# Patient Record
Sex: Female | Born: 1995
Health system: Southern US, Community
[De-identification: ages and names within clinical notes are randomized; demographics above are authoritative.]

## PROBLEM LIST (undated history)

## (undated) ENCOUNTER — Inpatient Hospital Stay: Payer: Self-pay

## (undated) DIAGNOSIS — D649 Anemia, unspecified: Secondary | ICD-10-CM

## (undated) DIAGNOSIS — Z975 Presence of (intrauterine) contraceptive device: Secondary | ICD-10-CM

## (undated) DIAGNOSIS — K8050 Calculus of bile duct without cholangitis or cholecystitis without obstruction: Secondary | ICD-10-CM

## (undated) DIAGNOSIS — E221 Hyperprolactinemia: Secondary | ICD-10-CM

## (undated) DIAGNOSIS — Z87440 Personal history of urinary (tract) infections: Secondary | ICD-10-CM

## (undated) DIAGNOSIS — K8020 Calculus of gallbladder without cholecystitis without obstruction: Secondary | ICD-10-CM

## (undated) DIAGNOSIS — L239 Allergic contact dermatitis, unspecified cause: Secondary | ICD-10-CM

## (undated) DIAGNOSIS — N926 Irregular menstruation, unspecified: Secondary | ICD-10-CM

## (undated) DIAGNOSIS — E236 Other disorders of pituitary gland: Secondary | ICD-10-CM

## (undated) DIAGNOSIS — N643 Galactorrhea not associated with childbirth: Secondary | ICD-10-CM

## (undated) DIAGNOSIS — N911 Secondary amenorrhea: Secondary | ICD-10-CM

## (undated) DIAGNOSIS — O209 Hemorrhage in early pregnancy, unspecified: Secondary | ICD-10-CM

## (undated) HISTORY — DX: Hyperprolactinemia: E22.1

## (undated) HISTORY — PX: OTHER SURGICAL HISTORY: SHX169

## (undated) HISTORY — DX: Presence of (intrauterine) contraceptive device: Z97.5

## (undated) HISTORY — DX: Irregular menstruation, unspecified: N92.6

## (undated) HISTORY — DX: Personal history of urinary (tract) infections: Z87.440

## (undated) HISTORY — DX: Hemorrhage in early pregnancy, unspecified: O20.9

## (undated) HISTORY — DX: Calculus of gallbladder without cholecystitis without obstruction: K80.20

## (undated) HISTORY — DX: Other disorders of pituitary gland: E23.6

## (undated) HISTORY — DX: Secondary amenorrhea: N91.1

## (undated) HISTORY — DX: Galactorrhea not associated with childbirth: N64.3

## (undated) HISTORY — DX: Calculus of bile duct without cholangitis or cholecystitis without obstruction: K80.50

## (undated) HISTORY — PX: NO PAST SURGERIES: SHX2092

## (undated) HISTORY — DX: Allergic contact dermatitis, unspecified cause: L23.9

---
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
## 2017-01-07 ENCOUNTER — Encounter: Payer: Self-pay | Admitting: Certified Nurse Midwife

## 2017-01-07 ENCOUNTER — Ambulatory Visit (INDEPENDENT_AMBULATORY_CARE_PROVIDER_SITE_OTHER): Payer: Medicaid Other | Admitting: Certified Nurse Midwife

## 2017-01-07 VITALS — BP 124/91 | HR 80 | Ht 60.0 in | Wt 141.7 lb

## 2017-01-07 DIAGNOSIS — N898 Other specified noninflammatory disorders of vagina: Secondary | ICD-10-CM | POA: Diagnosis not present

## 2017-01-07 DIAGNOSIS — Z30431 Encounter for routine checking of intrauterine contraceptive device: Secondary | ICD-10-CM

## 2017-01-07 NOTE — Progress Notes (Signed)
     GYNECOLOGY OFFICE PROGRESS NOTE  History:  21 y.o. G1P1001 here today for today for IUD string check; ParaGard IUD was placed  12/10/2016.   Reports intermittent abdominal cramping and increased vaginal discharge.   Denies difficulty breathing or respiratory distress, chest pain, abdominal pain, excessive vaginal bleeding, dysuria, and leg pain or swelling.   The following portions of the patient's history were reviewed and updated as appropriate: allergies, current medications, past family history, past medical history, past social history, past surgical history and problem list. Last pap smear: N/A.   Review of Systems:   Pertinent items are noted in HPI.  Objective:   Blood pressure (!) 124/91, pulse 80, height 5' (1.524 m), weight 141 lb 11.2 oz (64.3 kg), last menstrual period 12/10/2016, not currently breastfeeding.   Physical Exam  CONSTITUTIONAL: Well-developed, well-nourished female in no acute distress.   HENT:  Normocephalic, atraumatic. External right and left ear normal. Oropharynx is clear and moist  EYES: Conjunctivae and EOM are normal. Pupils are equal, round, and reactive to light. No scleral icterus.   NECK: Normal range of motion, supple, no masses  ABDOMEN: Soft, no distention noted.    PELVIC: Normal appearing external genitalia; normal appearing vaginal mucosa and cervix, vaginal discharge present.  IUD strings visualized, about three (3) cm in length outside cervix.   Assessment & Plan:  Normal IUD check.  NuSwab collected, will notify patient via MyChart with results.   Patient to keep IUD in place for 10 years; can come in for removal if she desires pregnancy within the next five years.  Routine preventative health maintenance measures emphasized.  Reviewed red flag symptoms and when to call.   RTC as needed.    Alicia Sullivan, CNM

## 2017-01-07 NOTE — Patient Instructions (Signed)
Intrauterine Device Information An intrauterine device (IUD) is inserted into your uterus to prevent pregnancy. There are two types of IUDs available:  Copper IUD-This type of IUD is wrapped in copper wire and is placed inside the uterus. Copper makes the uterus and fallopian tubes produce a fluid that kills sperm. The copper IUD can stay in place for 10 years.  Hormone IUD-This type of IUD contains the hormone progestin (synthetic progesterone). The hormone thickens the cervical mucus and prevents sperm from entering the uterus. It also thins the uterine lining to prevent implantation of a fertilized egg. The hormone can weaken or kill the sperm that get into the uterus. One type of hormone IUD can stay in place for 5 years, and another type can stay in place for 3 years.  Your health care provider will make sure you are a good candidate for a contraceptive IUD. Discuss with your health care provider the possible side effects. Advantages of an intrauterine device  IUDs are highly effective, reversible, long acting, and low maintenance.  There are no estrogen-related side effects.  An IUD can be used when breastfeeding.  IUDs are not associated with weight gain.  The copper IUD works immediately after insertion.  The hormone IUD works right away if inserted within 7 days of your period starting. You will need to use a backup method of birth control for 7 days if the hormone IUD is inserted at any other time in your cycle.  The copper IUD does not interfere with your female hormones.  The hormone IUD can make heavy menstrual periods lighter and decrease cramping.  The hormone IUD can be used for 3 or 5 years.  The copper IUD can be used for 10 years. Disadvantages of an intrauterine device  The hormone IUD can be associated with irregular bleeding patterns.  The copper IUD can make your menstrual flow heavier and more painful.  You may experience cramping and vaginal bleeding after  insertion. This information is not intended to replace advice given to you by your health care provider. Make sure you discuss any questions you have with your health care provider. Document Released: 01/20/2004 Document Revised: 07/24/2015 Document Reviewed: 08/06/2012 Elsevier Interactive Patient Education  2017 [place] 18-39 Years, Female Preventive care refers to lifestyle choices and visits with your health care provider that can promote health and wellness. What does preventive care include?  A yearly physical exam. This is also called an annual well check.  Dental exams once or twice a year.  Routine eye exams. Ask your health care provider how often you should have your eyes checked.  Personal lifestyle choices, including: ? Daily care of your teeth and gums. ? Regular physical activity. ? Eating a healthy diet. ? Avoiding tobacco and drug use. ? Limiting alcohol use. ? Practicing safe sex. ? Taking vitamin and mineral supplements as recommended by your health care provider. What happens during an annual well check? The services and screenings done by your health care provider during your annual well check will depend on your age, overall health, lifestyle risk factors, and family history of disease. Counseling Your health care provider may ask you questions about your:  Alcohol use.  Tobacco use.  Drug use.  Emotional well-being.  Home and relationship well-being.  Sexual activity.  Eating habits.  Work and work Statistician.  Method of birth control.  Menstrual cycle.  Pregnancy history.  Screening You may have the following tests or measurements:  Height, weight,  and BMI.  Diabetes screening. This is done by checking your blood sugar (glucose) after you have not eaten for a while (fasting).  Blood pressure.  Lipid and cholesterol levels. These may be checked every 5 years starting at age 60.  Skin check.  Hepatitis C blood  test.  Hepatitis B blood test.  Sexually transmitted disease (STD) testing.  BRCA-related cancer screening. This may be done if you have a family history of breast, ovarian, tubal, or peritoneal cancers.  Pelvic exam and Pap test. This may be done every 3 years starting at age 5. Starting at age 17, this may be done every 5 years if you have a Pap test in combination with an HPV test.  Discuss your test results, treatment options, and if necessary, the need for more tests with your health care provider. Vaccines Your health care provider may recommend certain vaccines, such as:  Influenza vaccine. This is recommended every year.  Tetanus, diphtheria, and acellular pertussis (Tdap, Td) vaccine. You may need a Td booster every 10 years.  Varicella vaccine. You may need this if you have not been vaccinated.  HPV vaccine. If you are 12 or younger, you may need three doses over 6 months.  Measles, mumps, and rubella (MMR) vaccine. You may need at least one dose of MMR. You may also need a second dose.  Pneumococcal 13-valent conjugate (PCV13) vaccine. You may need this if you have certain conditions and were not previously vaccinated.  Pneumococcal polysaccharide (PPSV23) vaccine. You may need one or two doses if you smoke cigarettes or if you have certain conditions.  Meningococcal vaccine. One dose is recommended if you are age 78-21 years and a first-year college student living in a residence hall, or if you have one of several medical conditions. You may also need additional booster doses.  Hepatitis A vaccine. You may need this if you have certain conditions or if you travel or work in places where you may be exposed to hepatitis A.  Hepatitis B vaccine. You may need this if you have certain conditions or if you travel or work in places where you may be exposed to hepatitis B.  Haemophilus influenzae type b (Hib) vaccine. You may need this if you have certain risk factors.  Talk  to your health care provider about which screenings and vaccines you need and how often you need them. This information is not intended to replace advice given to you by your health care provider. Make sure you discuss any questions you have with your health care provider. Document Released: 04/13/2001 Document Revised: 11/05/2015 Document Reviewed: 12/17/2014 Elsevier Interactive Patient Education  2017 [employer].

## 2017-01-12 LAB — NUSWAB VAGINITIS PLUS (VG+)
ATOPOBIUM VAGINAE: HIGH {score} — AB
CANDIDA ALBICANS, NAA: NEGATIVE
CANDIDA GLABRATA, NAA: NEGATIVE
CHLAMYDIA TRACHOMATIS, NAA: NEGATIVE
NEISSERIA GONORRHOEAE, NAA: NEGATIVE
TRICH VAG BY NAA: NEGATIVE

## 2017-01-14 ENCOUNTER — Encounter: Payer: Self-pay | Admitting: Certified Nurse Midwife

## 2017-03-10 ENCOUNTER — Encounter: Payer: Self-pay | Admitting: Certified Nurse Midwife

## 2017-03-10 ENCOUNTER — Ambulatory Visit (INDEPENDENT_AMBULATORY_CARE_PROVIDER_SITE_OTHER): Payer: Medicaid Other | Admitting: Certified Nurse Midwife

## 2017-03-10 ENCOUNTER — Encounter: Payer: Medicaid Other | Admitting: Certified Nurse Midwife

## 2017-03-10 VITALS — BP 119/85 | HR 86 | Wt 139.8 lb

## 2017-03-10 DIAGNOSIS — L292 Pruritus vulvae: Secondary | ICD-10-CM

## 2017-03-10 DIAGNOSIS — Z975 Presence of (intrauterine) contraceptive device: Secondary | ICD-10-CM

## 2017-03-10 DIAGNOSIS — N898 Other specified noninflammatory disorders of vagina: Secondary | ICD-10-CM

## 2017-03-10 NOTE — Progress Notes (Signed)
Contraception- Patient currently has an IUD. She is having a moderate amount of brown discharge.

## 2017-03-10 NOTE — Patient Instructions (Signed)
Kegel Exercises Kegel exercises help strengthen the muscles that support the rectum, vagina, small intestine, bladder, and uterus. Doing Kegel exercises can help:  Improve bladder and bowel control.  Improve sexual response.  Reduce problems and discomfort during pregnancy.  Kegel exercises involve squeezing your pelvic floor muscles, which are the same muscles you squeeze when you try to stop the flow of urine. The exercises can be done while sitting, standing, or lying down, but it is best to vary your position. Phase 1 exercises 1. Squeeze your pelvic floor muscles tight. You should feel a tight lift in your rectal area. If you are a female, you should also feel a tightness in your vaginal area. Keep your stomach, buttocks, and legs relaxed. 2. Hold the muscles tight for up to 10 seconds. 3. Relax your muscles. Repeat this exercise 50 times a day or as many times as told by your health care provider. Continue to do this exercise for at least 4-6 weeks or for as long as told by your health care provider. This information is not intended to replace advice given to you by your health care provider. Make sure you discuss any questions you have with your health care provider. Document Released: 02/02/2012 Document Revised: 10/11/2015 Document Reviewed: 01/05/2015 Elsevier Interactive Patient Education  2018 [employer] Vaginal Yeast infection, Adult Vaginal yeast infection is a condition that causes soreness, swelling, and redness (inflammation) of the vagina. It also causes vaginal discharge. This is a common condition. Some women get this infection frequently. What are the causes? This condition is caused by a change in the normal balance of the yeast (candida) and bacteria that live in the vagina. This change causes an overgrowth of yeast, which causes the inflammation. What increases the risk? This condition is more likely to develop in:  Women who take antibiotic medicines.  Women  who have diabetes.  Women who take birth control pills.  Women who are pregnant.  Women who douche often.  Women who have a weak defense (immune) system.  Women who have been taking steroid medicines for a long time.  Women who frequently wear tight clothing.  What are the signs or symptoms? Symptoms of this condition include:  White, thick vaginal discharge.  Swelling, itching, redness, and irritation of the vagina. The lips of the vagina (vulva) may be affected as well.  Pain or a burning feeling while urinating.  Pain during sex.  How is this diagnosed? This condition is diagnosed with a medical history and physical exam. This will include a pelvic exam. Your health care provider will examine a sample of your vaginal discharge under a microscope. Your health care provider may send this sample for testing to confirm the diagnosis. How is this treated? This condition is treated with medicine. Medicines may be over-the-counter or prescription. You may be told to use one or more of the following:  Medicine that is taken orally.  Medicine that is applied as a cream.  Medicine that is inserted directly into the vagina (suppository).  Follow these instructions at home:  Take or apply over-the-counter and prescription medicines only as told by your health care provider.  Do not have sex until your health care provider has approved. Tell your sex partner that you have a yeast infection. That person should go to his or her health care provider if he or she develops symptoms.  Do not wear tight clothes, such as pantyhose or tight pants.  Avoid using tampons until your health care  provider approves.  Eat more yogurt. This may help to keep your yeast infection from returning.  Try taking a sitz bath to help with discomfort. This is a warm water bath that is taken while you are sitting down. The water should only come up to your hips and should cover your buttocks. Do this 3-4  times per day or as told by your health care provider.  Do not douche.  Wear breathable, cotton underwear.  If you have diabetes, keep your blood sugar levels under control. Contact a health care provider if:  You have a fever.  Your symptoms go away and then return.  Your symptoms do not get better with treatment.  Your symptoms get worse.  You have new symptoms.  You develop blisters in or around your vagina.  You have blood coming from your vagina and it is not your menstrual period.  You develop pain in your abdomen. This information is not intended to replace advice given to you by your health care provider. Make sure you discuss any questions you have with your health care provider. Document Released: 11/25/2004 Document Revised: 07/30/2015 Document Reviewed: 08/19/2014 Elsevier Interactive Patient Education  2018 [employer].

## 2017-03-13 DIAGNOSIS — Z975 Presence of (intrauterine) contraceptive device: Secondary | ICD-10-CM | POA: Insufficient documentation

## 2017-03-13 HISTORY — DX: Presence of (intrauterine) contraceptive device: Z97.5

## 2017-03-13 MED ORDER — FLUCONAZOLE 150 MG PO TABS: 150.0000 mg | ORAL_TABLET | Freq: Once | ORAL | 0 refills | Status: AC

## 2017-03-13 NOTE — Progress Notes (Signed)
GYN ENCOUNTER NOTE  Subjective:       Alicia Sullivan is a 22 y.o. G74P1001 female is here for gynecologic evaluation of the following issues:  1. Brown vaginal discharge 2. Vulvar itching  3. Vaginal odor  Endorses symptoms since 02/21/2017, but itching did not start until approximately two(2) days ago.   Denies difficulty breathing or respiratory distress, chest pain, abdominal pain, vaginal bleeding, dysuria, and leg pain or swelling.   Gynecologic History  Patient's last menstrual period was 02/21/2017 (approximate).  Contraception: IUD, ParaGard  Last Pap: N/A.   Obstetric History  OB History  Gravida Para Term Preterm AB Living  1 1 1     1   SAB TAB Ectopic Multiple Live Births        0 1    # Outcome Date GA Lbr Len/2nd Weight Sex Delivery Anes PTL Lv  1 Term 10/23/16 [redacted]w[redacted]d / 00:13 5 lb 4 oz (2.38 kg) F Vag-Spont EPI  LIV     Birth Comments: no anomalies noted at delivery      Past Medical History:  Diagnosis Date  . Amenorrhea, secondary    pregnant  . Biliary colic   . Galactorrhea 01/08/2014  . Gallstones 09/22/2016  . 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 Irregular menses; hx   . 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 Recent urinary tract infection 02/18/2016   seen in ER  . Vaginal bleeding in pregnancy, first trimester 03/10/2016    Past Surgical History:  Procedure Laterality Date  . none      No current outpatient medications on file prior to visit.   No current facility-administered medications on file prior to visit.     No Known Allergies  Social History   Socioeconomic History  . Marital status: Married    Spouse name: Not on file  . Number of children: Not on file  . Years of education: Not on file  . Highest education level: Not on file  Social Needs  . Financial resource strain: Not on file  . Food insecurity - worry: Not on file  . Food insecurity - inability: Not on file  . Transportation needs - medical: Not on file  .  Transportation needs - non-medical: Not on file  Occupational History    Comment: nonemployed  Tobacco Use  . Smoking status: Never Smoker  . Smokeless tobacco: Never Used  Substance and Sexual Activity  . Alcohol use: No    Comment: before pregnancy  . Drug use: No  . Sexual activity: Yes    Partners: Male    Birth control/protection: IUD  Other Topics Concern  . Not on file  Social History Narrative  . Not on file    Family History  Problem Relation Age of Onset  . Diabetes Father   . Hyperlipidemia Father     The following portions of the patient's history were reviewed and updated as appropriate: allergies, current medications, past family history, past medical history, past social history, past surgical history and problem list.  Review of Systems  Review of Systems - Negative except as noted above. History obtained from the patient.  Objective:   BP 119/85   Pulse 86   Wt 139 lb 12.8 oz (63.4 kg)   LMP 02/21/2017 (Approximate)   Breastfeeding? No   BMI 27.30 kg/m    CONSTITUTIONAL: Well-developed, well-nourished female in no acute distress.   HENT:  Normocephalic, atraumatic.    SKIN: Skin is warm and dry. No rash noted.  Not diaphoretic. No erythema. No pallor.  NEUROLGIC: Alert and oriented to person, place, and time.   PSYCHIATRIC: Normal mood and affect. Normal behavior. Normal judgment and thought content.  ABDOMEN: Soft, non distended; Non tender.  No Organomegaly.  PELVIC:  External Genitalia: Erythema noted, white thick discharge present  Vagina: Thick white discharge present  Cervix: Normal, IUD strings present  Uterus: Normal size, shape,consistency,mobile  Adnexa: Normal   MUSCULOSKELETAL: Normal range of motion. No tenderness.  No cyanosis, clubbing, or edema.  Assessment:   1. Vaginal discharge  - NuSwab Vaginitis (VG)  2. Vulvar itching  - NuSwab Vaginitis (VG)     Plan:   Labs: NuSwab collected, will contact pt via  MyChart with results.   Rx: Diflucan, see orders.   Discussed vaginal health techniques.   Reviewed red flag symptoms and when to call.   RTC if symptoms worsen or fail to improve.    Gunnar BullaJenkins Michelle Fin Hupp, CNM [facility], [facility]

## 2017-03-16 LAB — NUSWAB VAGINITIS (VG)
CANDIDA GLABRATA, NAA: NEGATIVE
Candida albicans, NAA: POSITIVE — AB
TRICH VAG BY NAA: NEGATIVE

## 2017-04-29 ENCOUNTER — Ambulatory Visit (INDEPENDENT_AMBULATORY_CARE_PROVIDER_SITE_OTHER): Payer: Medicaid Other | Admitting: Surgery

## 2017-04-29 ENCOUNTER — Encounter: Payer: Self-pay | Admitting: Surgery

## 2017-04-29 VITALS — BP 142/91 | HR 80 | Temp 97.7°F | Ht 60.0 in | Wt 143.8 lb

## 2017-04-29 DIAGNOSIS — K8020 Calculus of gallbladder without cholecystitis without obstruction: Secondary | ICD-10-CM

## 2017-04-29 NOTE — Progress Notes (Signed)
Alicia Sullivan is an 22 y.o. female.   Chief Complaint:gallstones  Referred by OB/GYN HPI: This patient several months postpartum who has known gallstones.  I cannot see in her record where she has been to the ER recently for any pain.  Patient reports that she has been asymptomatic since being postpartum.  She has no nausea vomiting fevers or chills no right upper quadrant pain.  She has been essentially asymptomatic but does report occasional chest pain when she eats spicy foods  Past Medical History:  Diagnosis Date  . Allergic contact dermatitis 11/10/2016  . Amenorrhea, secondary    pregnant  . Biliary colic   . Galactorrhea 01/08/2014  . Gallstones 09/22/2016  . 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 Irregular menses; hx   . IUD (intrauterine device) in place 03/13/2017  . 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 Recent urinary tract infection 02/18/2016   seen in ER  . Vaginal bleeding in pregnancy, first trimester 03/10/2016    Past Surgical History:  Procedure Laterality Date  . none      Family History  Problem Relation Age of Onset  . Diabetes Father   . Hyperlipidemia Father    Social History:  reports that  has never smoked. she has never used smokeless tobacco. She reports that she does not drink alcohol or use drugs.  Allergies: No Known Allergies   (Not in a hospital admission)   Review of Systems:   Review of Systems  Constitutional: Negative.   HENT: Negative.   Eyes: Negative.   Respiratory: Negative.   Cardiovascular: Negative.   Gastrointestinal: Negative for abdominal pain, blood in stool, constipation, diarrhea, heartburn, nausea and vomiting.  Genitourinary: Negative.   Musculoskeletal: Negative.   Skin: Negative.   Neurological: Negative.   Endo/Heme/Allergies: Negative.   Psychiatric/Behavioral: Negative.     Physical Exam:  Physical Exam  Constitutional: She is oriented to person, place, and time and well-developed, well-nourished,  and in no distress. No distress.  HENT:  Head: Normocephalic and atraumatic.  Eyes: Pupils are equal, round, and reactive to light. Right eye exhibits no discharge. Left eye exhibits no discharge. No scleral icterus.  Neck: Normal range of motion. No JVD present.  Cardiovascular: Normal rate, regular rhythm and normal heart sounds.  Pulmonary/Chest: Effort normal and breath sounds normal. No respiratory distress. She has no wheezes. She has no rales.  Abdominal: Soft. She exhibits no distension. There is no tenderness. There is no rebound and no guarding.  Musculoskeletal: Normal range of motion. She exhibits no edema or tenderness.  Lymphadenopathy:    She has no cervical adenopathy.  Neurological: She is alert and oriented to person, place, and time.  Skin: Skin is warm and dry. No rash noted. She is not diaphoretic. No erythema.  Psychiatric: Mood and affect normal.  Vitals reviewed.   not currently breastfeeding.    No results found for this or any previous visit (from the past 48 hour(s)). No results found.   Assessment/Plan This a patient with known gallstones.  She has had no recent labs but her ultrasound is been reviewed that showed stones without signs of acute cholecystitis.  I discussed with her the rationale for offering surgery at some point and the options of continued observation.  I reviewed the risks of bleeding and infection recurrence conversion to an open procedure recurrence of symptoms and bile duct injury bowel injury or bile duct leak.  At this point patient recognizes that her symptoms are easily controlled with diet  and she has not had any symptoms in 2 weeks.  Her symptoms then were very mild.  She wishes to forego surgery at this time.  I suggested she come back to see me in 1 month and we can review her symptoms and discuss surgical options should her symptoms become worse.  Lattie Haw, MD, FACS

## 2017-04-29 NOTE — Patient Instructions (Signed)
Please keep a diary of the foods you eat that cause you to have chest pain. Please see your follow up appointment listed below.

## 2017-06-01 ENCOUNTER — Ambulatory Visit: Payer: Self-pay | Admitting: Surgery

## 2017-07-05 ENCOUNTER — Ambulatory Visit (INDEPENDENT_AMBULATORY_CARE_PROVIDER_SITE_OTHER): Payer: Medicaid Other | Admitting: Certified Nurse Midwife

## 2017-07-05 VITALS — BP 113/76 | HR 73 | Ht 60.0 in | Wt 143.2 lb

## 2017-07-05 DIAGNOSIS — Z202 Contact with and (suspected) exposure to infections with a predominantly sexual mode of transmission: Secondary | ICD-10-CM

## 2017-07-05 DIAGNOSIS — N898 Other specified noninflammatory disorders of vagina: Secondary | ICD-10-CM | POA: Diagnosis not present

## 2017-07-05 NOTE — Progress Notes (Signed)
Pt would like STD testing. Also c/o vag odor.

## 2017-07-05 NOTE — Patient Instructions (Signed)

## 2017-07-05 NOTE — Progress Notes (Signed)
GYN ENCOUNTER NOTE  Subjective:       Alicia Sullivan is a 22 y.o. G45P1001 female is here for gynecologic evaluation of the following issues:  1. Vaginal discharge  2. Vaginal odor   Reports increased discharge and vaginal odor for the last three (3) weeks. Usually happens immediately prior to menses, but typical does not last for this long. Has not attempted any home treatment measures. Desires STI testing.   Denies difficulty breathing or respiratory distress, chest pain, abdominal pain, excessive vaginal bleeding, dysuria, and leg pain or swelling.    Gynecologic History  Patient's last menstrual period was 07/05/2017 (exact date).  Contraception: IUD, ParaGard  Last Pap: N/A.    Obstetric History  OB History  Gravida Para Term Preterm AB Living  SAB TAB Ectopic Multiple Live Births        0 1    # Outcome Date GA Lbr Len/2nd Weight Sex Delivery Anes PTL Lv  1 Term 10/23/16 [redacted]w[redacted]d / 00:13 5 lb 4 oz (2.38 kg) F Vag-Spont EPI  LIV     Birth Comments: no anomalies noted at delivery    Past Medical History:  Diagnosis Date  . Allergic contact dermatitis 11/10/2016  . Amenorrhea, secondary    pregnant  . Biliary colic   . Galactorrhea 01/08/2014  . Gallstones 09/22/2016  . 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 Irregular menses; hx   . IUD (intrauterine device) in place 03/13/2017  . 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 Recent urinary tract infection 02/18/2016   seen in ER  . Vaginal bleeding in pregnancy, first trimester 03/10/2016    Past Surgical History:  Procedure Laterality Date  . none      No current outpatient medications on file prior to visit.   No current facility-administered medications on file prior to visit.     No Known Allergies  Social History   Socioeconomic History  . Marital status: Married    Spouse name: Not on file  . Number of children: Not on file  . Years of education: Not on file  . Highest education level: Not  on file  Occupational History    Comment: nonemployed  Social Needs  . Financial resource strain: Not on file  . Food insecurity:    Worry: Not on file    Inability: Not on file  . Transportation needs:    Medical: Not on file    Non-medical: Not on file  Tobacco Use  . Smoking status: Never Smoker  . Smokeless tobacco: Never Used  Substance and Sexual Activity  . Alcohol use: No    Comment: before pregnancy  . Drug use: No  . Sexual activity: Yes    Partners: Male    Birth control/protection: None  Lifestyle  . Physical activity:    Days per week: Not on file    Minutes per session: Not on file  . Stress: Not on file  Relationships  . Social connections:    Talks on phone: Not on file    Gets together: Not on file    Attends religious service: Not on file    Active member of club or organization: Not on file    Attends meetings of clubs or organizations: Not on file    Relationship status: Not on file  . Intimate partner violence:    Fear of current or ex partner: Not on file    Emotionally abused: Not on file  Physically abused: Not on file    Forced sexual activity: Not on file  Other Topics Concern  . Not on file  Social History Narrative  . Not on file    Family History  Problem Relation Age of Onset  . Diabetes Father   . Hyperlipidemia Father     The following portions of the patient's history were reviewed and updated as appropriate: allergies, current medications, past family history, past medical history, past social history, past surgical history and problem list.  Review of Systems  Review of Systems - Negative except as noted above.  History obtained from the patient.  Objective:   BP 113/76   Pulse 73   Ht 5' (1.524 m)   Wt 143 lb 3 oz (64.9 kg)   LMP 07/05/2017 (Exact Date)   Breastfeeding? No   BMI 27.96 kg/m   CONSTITUTIONAL: Well-developed, well-nourished female in no acute distress.   PELVIC:  External Genitalia:  Normal  Vagina: Normal  Cervix: Normal, IUD strings present, NuSwab collected  Uterus: Normal size, shape,consistency, mobile  Adnexa: Normal  RV: Normal   MUSCULOSKELETAL: Normal range of motion. No tenderness.  No cyanosis, clubbing, or edema.  Assessment:   1. Vaginal odor  - NuSwab Vaginitis Plus (VG+)  2. Vaginal discharge  - NuSwab Vaginitis Plus (VG+)  3. Possible exposure to STD  - NuSwab Vaginitis Plus (VG+)   Plan:   Labs: NuSwab collected, will contact patient with results.   Discussed vaginal health techniques.   RTC as needed.    Gunnar Bulla, CNM [facility], [facility]

## 2017-07-07 ENCOUNTER — Telehealth: Payer: Self-pay | Admitting: Certified Nurse Midwife

## 2017-07-07 ENCOUNTER — Telehealth: Payer: Self-pay

## 2017-07-07 LAB — NUSWAB VAGINITIS PLUS (VG+)
ATOPOBIUM VAGINAE: HIGH {score} — AB
Candida albicans, NAA: NEGATIVE
Candida glabrata, NAA: NEGATIVE
Chlamydia trachomatis, NAA: NEGATIVE
MEGASPHAERA 1: HIGH {score} — AB
Neisseria gonorrhoeae, NAA: NEGATIVE
Trich vag by NAA: NEGATIVE

## 2017-07-07 NOTE — Progress Notes (Signed)
Please contact patient. NuSwab positive for Bv. May have Flagyl, Metrogel or Solosec as treatment. Encourge My Chart activation. Thanks, JML

## 2017-07-07 NOTE — Telephone Encounter (Signed)
The patient called and stated that she missed a call from her nurse and that she would like to get a call back if possible today. Please advise.

## 2017-07-07 NOTE — Telephone Encounter (Signed)
Attempted to contact pt- phone rang,picked up then a busy signal. Unable to leave message.

## 2017-07-08 ENCOUNTER — Telehealth: Payer: Self-pay

## 2017-07-08 ENCOUNTER — Other Ambulatory Visit: Payer: Self-pay

## 2017-07-08 MED ORDER — METRONIDAZOLE 500 MG PO TABS: 500.0000 mg | ORAL_TABLET | Freq: Two times a day (BID) | ORAL | 0 refills | Status: DC

## 2017-07-08 NOTE — Telephone Encounter (Signed)
Attempted to call pt back- phone rang busy.

## 2017-07-08 NOTE — Telephone Encounter (Signed)
Pt informed of test results and providers instructions. Script sent to [facility] [address]  Per pt.

## 2017-08-04 ENCOUNTER — Other Ambulatory Visit: Payer: Self-pay | Admitting: Certified Nurse Midwife

## 2017-09-22 ENCOUNTER — Ambulatory Visit (INDEPENDENT_AMBULATORY_CARE_PROVIDER_SITE_OTHER): Payer: Medicaid Other | Admitting: Certified Nurse Midwife

## 2017-09-22 VITALS — BP 113/76 | HR 78 | Ht 60.0 in | Wt 141.2 lb

## 2017-09-22 DIAGNOSIS — N898 Other specified noninflammatory disorders of vagina: Secondary | ICD-10-CM

## 2017-09-22 DIAGNOSIS — Z202 Contact with and (suspected) exposure to infections with a predominantly sexual mode of transmission: Secondary | ICD-10-CM

## 2017-09-22 NOTE — Patient Instructions (Signed)

## 2017-09-22 NOTE — Progress Notes (Signed)
Pt is here with c/o thick white vaginal discharge. Denies itching.

## 2017-09-29 ENCOUNTER — Telehealth: Payer: Self-pay

## 2017-09-29 LAB — NUSWAB VAGINITIS PLUS (VG+)
CANDIDA ALBICANS, NAA: POSITIVE — AB
Candida glabrata, NAA: NEGATIVE
Chlamydia trachomatis, NAA: NEGATIVE
Neisseria gonorrhoeae, NAA: NEGATIVE
TRICH VAG BY NAA: NEGATIVE

## 2017-09-29 MED ORDER — FLUCONAZOLE 150 MG PO TABS: 150.0000 mg | ORAL_TABLET | Freq: Once | ORAL | 0 refills | Status: AC

## 2017-09-29 NOTE — Progress Notes (Signed)
Please contact patient. NuSwab positive for yeast. Diflucan Rx sent to pharmacy on file. Encourage to activate MyChart. Follow up as needed. Thanks, JML

## 2017-09-29 NOTE — Telephone Encounter (Signed)
Pt informed of test results per ML. Will go to pharmacy to pick up script.
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
## 2018-04-02 ENCOUNTER — Encounter: Payer: Self-pay | Admitting: Emergency Medicine

## 2018-04-02 ENCOUNTER — Other Ambulatory Visit: Payer: Self-pay

## 2018-04-02 DIAGNOSIS — K8020 Calculus of gallbladder without cholecystitis without obstruction: Secondary | ICD-10-CM | POA: Diagnosis not present

## 2018-04-02 DIAGNOSIS — R1011 Right upper quadrant pain: Secondary | ICD-10-CM | POA: Diagnosis present

## 2018-04-02 LAB — URINALYSIS, COMPLETE (UACMP) WITH MICROSCOPIC
Bilirubin Urine: NEGATIVE
GLUCOSE, UA: NEGATIVE mg/dL
KETONES UR: NEGATIVE mg/dL
NITRITE: NEGATIVE
PH: 6 (ref 5.0–8.0)
PROTEIN: 30 mg/dL — AB
Specific Gravity, Urine: 1.017 (ref 1.005–1.030)

## 2018-04-02 LAB — COMPREHENSIVE METABOLIC PANEL
ALBUMIN: 4.1 g/dL (ref 3.5–5.0)
ALT: 12 U/L (ref 0–44)
AST: 20 U/L (ref 15–41)
Alkaline Phosphatase: 74 U/L (ref 38–126)
Anion gap: 8 (ref 5–15)
BUN: 9 mg/dL (ref 6–20)
CALCIUM: 9.3 mg/dL (ref 8.9–10.3)
CO2: 28 mmol/L (ref 22–32)
CREATININE: 0.45 mg/dL (ref 0.44–1.00)
Chloride: 103 mmol/L (ref 98–111)
GFR calc Af Amer: >60 mL/min (ref >60)
GLUCOSE: 98 mg/dL (ref 70–99)
POTASSIUM: 3.7 mmol/L (ref 3.5–5.1)
SODIUM: 139 mmol/L (ref 135–145)
TOTAL PROTEIN: 7.8 g/dL (ref 6.5–8.1)
Total Bilirubin: 0.7 mg/dL (ref 0.3–1.2)

## 2018-04-02 LAB — TROPONIN I: Troponin I: <0.03 ng/mL (ref <0.03)

## 2018-04-02 LAB — CBC
HCT: 30.8 % — ABNORMAL LOW (ref 36.0–46.0)
Hemoglobin: 9.2 g/dL — ABNORMAL LOW (ref 12.0–15.0)
MCH: 23.8 pg — AB (ref 26.0–34.0)
MCHC: 29.9 g/dL — AB (ref 30.0–36.0)
MCV: 79.6 fL — ABNORMAL LOW (ref 80.0–100.0)
Platelets: 391 10*3/uL (ref 150–400)
RBC: 3.87 MIL/uL (ref 3.87–5.11)
RDW: 15.8 % — ABNORMAL HIGH (ref 11.5–15.5)
WBC: 11 10*3/uL — AB (ref 4.0–10.5)
nRBC: 0 % (ref 0.0–0.2)

## 2018-04-02 LAB — POCT PREGNANCY, URINE: PREG TEST UR: NEGATIVE

## 2018-04-02 LAB — LIPASE, BLOOD: LIPASE: 54 U/L — AB (ref 11–51)

## 2018-04-02 NOTE — ED Triage Notes (Signed)
Patient with complaint of right upper abdominal pain times one week. Patient reports that the pain became worse today. Patient states that she has had nausea but denies vomiting. Patient also denies shortness of breath.

## 2018-04-03 ENCOUNTER — Emergency Department
Admission: EM | Admit: 2018-04-03 | Discharge: 2018-04-03 | Disposition: A | Discharge status: Home / Self Care | Attending: Emergency Medicine | Admitting: Emergency Medicine

## 2018-04-03 ENCOUNTER — Emergency Department

## 2018-04-03 DIAGNOSIS — R1011 Right upper quadrant pain: Secondary | ICD-10-CM

## 2018-04-03 DIAGNOSIS — K8020 Calculus of gallbladder without cholecystitis without obstruction: Secondary | ICD-10-CM

## 2018-04-03 MED ORDER — OXYCODONE-ACETAMINOPHEN 5-325 MG PO TABS: 1.0000 | ORAL_TABLET | ORAL | 0 refills | Status: DC | PRN

## 2018-04-03 MED ORDER — ONDANSETRON 4 MG PO TBDP: 4.0000 mg | ORAL_TABLET | Freq: Three times a day (TID) | ORAL | 0 refills | Status: DC | PRN

## 2018-04-03 NOTE — ED Provider Notes (Signed)
[facility] Emergency Department Provider Note __________   First MD Initiated Contact with Patient 04/03/18 0140     (approximate)  I have reviewed the triage vital signs and the nursing notes.   HISTORY  Chief Complaint Abdominal Pain   HPI Alicia FantasiaJazmin Dorantes is a 23 y.o. female with below list of chronic medical conditions including previously diagnosed cholelithiasis presents to the emergency department with 1 week history of right upper quadrant abdominal pain intermittently  with acute worsening today.  Patient admits to nausea however no vomiting.  Patient denies any fever afebrile on presentation temperature 98.2.  Patient denies any urinary symptoms no diarrhea or constipation.   Past Medical History:  Diagnosis Date  . Allergic contact dermatitis 11/10/2016  . Amenorrhea, secondary    pregnant  . Biliary colic   . Galactorrhea 01/08/2014  . Gallstones 09/22/2016  . 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 Irregular menses; hx   . IUD (intrauterine device) in place 03/13/2017  . 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 Recent urinary tract infection 02/18/2016   seen in ER  . Vaginal bleeding in pregnancy, first trimester 03/10/2016    Patient Active Problem List   Diagnosis Date Noted  . IUD (intrauterine device) in place 03/13/2017  . Allergic contact dermatitis 11/10/2016  . Gallstones 09/22/2016  . 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 none      Prior to Admission medications   Medication Sig Start Date End Date Taking? Authorizing Provider  ondansetron (ZOFRAN ODT) 4 MG disintegrating tablet Take 1 tablet (4 mg total) by mouth every 8 (eight) hours as needed. 04/03/18   Darci CurrentBrown, [place] N, MD  oxyCODONE-acetaminophen (PERCOCET) 5-325 MG tablet Take 1 tablet by mouth every 4 (four) hours as needed for up to 10 doses. 04/03/18   Darci CurrentBrown, [place] N, MD      Allergies Patient has no known allergies.  Family History  Problem Relation Age of Onset  . Diabetes Father   . Hyperlipidemia Father     Social History Social History   Tobacco Use  . Smoking status: Never Smoker  . Smokeless tobacco: Never Used  Substance Use Topics  . Alcohol use: No  . Drug use: No    Review of Systems Constitutional: No fever/chills Eyes: No visual changes. ENT: No sore throat. Cardiovascular: Denies chest pain. Respiratory: Denies shortness of breath. Gastrointestinal: Positive for abdominal pain and nausea no vomiting.  No diarrhea.  No constipation. Genitourinary: Negative for dysuria. Musculoskeletal: Negative for neck pain.  Negative for back pain. Integumentary: Negative for rash. Neurological: Negative for headaches, focal weakness or numbness.   ____________________________________________   PHYSICAL EXAM:  VITAL SIGNS: ED Triage Vitals  Enc Vitals Group     BP 04/02/18 1919 117/69     Pulse Rate 04/02/18 1919 87     Resp 04/02/18 1919 18     Temp 04/02/18 1919 98.2 F (36.8 C)     Temp Source 04/02/18 1919 Oral     SpO2 04/02/18 1919 99 %     Weight 04/02/18 1920 59 kg (130 lb)     Height 04/02/18 1920 1.499 m (4\' 11" )     Head Circumference --      Peak Flow --      Pain Score 04/02/18 1920 6     Pain Loc --      Pain Edu? --  Excl. in GC? --     Constitutional: Alert and oriented. Well appearing and in no acute distress. Eyes: Conjunctivae are normal. Mouth/Throat: Mucous membranes are moist.Oropharynx non-erythematous. Neck: No stridor.  Cardiovascular: Normal rate, regular rhythm. Good peripheral circulation. Grossly normal heart sounds. Respiratory: Normal respiratory effort.  No retractions. Lungs CTAB. Gastrointestinal: Right upper quadrant tenderness to palpation.. No distention.  Musculoskeletal: No lower extremity tenderness nor edema. No gross deformities of extremities. Neurologic:  Normal speech  and language. No gross focal neurologic deficits are appreciated.  Skin:  Skin is warm, dry and intact. No rash noted.  ____________________________________________   LABS (all labs ordered are listed, but only abnormal results are displayed)  Labs Reviewed  LIPASE, BLOOD - Abnormal; Notable for the following components:      Result Value   Lipase 54 (*)    All other components within normal limits  CBC - Abnormal; Notable for the following components:   WBC 11.0 (*)    Hemoglobin 9.2 (*)    HCT 30.8 (*)    MCV 79.6 (*)    MCH 23.8 (*)    MCHC 29.9 (*)    RDW 15.8 (*)    All other components within normal limits  URINALYSIS, COMPLETE (UACMP) WITH MICROSCOPIC - Abnormal; Notable for the following components:   Color, Urine YELLOW (*)    APPearance CLOUDY (*)    Hgb urine dipstick SMALL (*)    Protein, ur 30 (*)    Leukocytes, UA LARGE (*)    WBC, UA >50 (*)    Bacteria, UA FEW (*)    All other components within normal limits  COMPREHENSIVE METABOLIC PANEL  TROPONIN I  POC URINE PREG, ED  POCT PREGNANCY, URINE   ____________________________________________  EKG  ED ECG REPORT I, [place] N BROWN, the attending physician, personally viewed and interpreted this ECG.   Date: 04/02/2018  EKG Time: 7:23 PM  Rate: 83  Rhythm: Normal sinus rhythm  Axis: Normal  Intervals: Normal  ST&T Change: None  ____________________________________________  RADIOLOGY I, [place] N BROWN, personally viewed and evaluated these images (plain radiographs) as part of my medical decision making, as well as reviewing the written report by the radiologist.  ED MD interpretation: Cholelithiasis without any evidence of cholecystitis per radiologist.  Official radiology report(s): US Abdomen Limited Ruq  Result Date: 04/03/2018 CLINICAL DATA:  Right upper quadrant pain.  History of gallstones. EXAM: ULTRASOUND ABDOMEN LIMITED RIGHT UPPER QUADRANT COMPARISON:  09/22/2016 FINDINGS:  Gallbladder: Cholelithiasis with multiple stones filling the gallbladder. Similar appearance to previous study. No gallbladder wall thickening or edema. Murphy's sign is negative. Common bile duct: Diameter: 4.3 mm, normal Liver: No focal lesion identified. Within normal limits in parenchymal echogenicity. Portal vein is patent on color Doppler imaging with normal direction of blood flow towards the liver. IMPRESSION: Cholelithiasis. No additional changes to suggest acute cholecystitis. Similar appearance to previous study. Electronically Signed   By: Burman Nieves M.D.   On: 04/03/2018 02:28    ________________________  Procedures   ____________________________________________   INITIAL IMPRESSION / ASSESSMENT AND PLAN / ED COURSE  As part of my medical decision making, I reviewed the following data within the electronic MEDICAL RECORD NUMBER  23 year old female presenting with above-stated history and physical exam consistent with cholelithiasis versus cholecystitis.  Right upper quadrant ultrasound consistent with cholelithiasis without any evidence of cholecystitis per radiologist.  Patient be prescribed Zofran and Percocet at home with referral to general surgery.    ____________________________________________  FINAL CLINICAL IMPRESSION(S) / ED DIAGNOSES  Final diagnoses:  RUQ pain  Gallstones     MEDICATIONS GIVEN DURING THIS VISIT:  Medications - No data to display   ED Discharge Orders         Ordered    ondansetron (ZOFRAN ODT) 4 MG disintegrating tablet  Every 8 hours PRN     04/03/18 0313    oxyCODONE-acetaminophen (PERCOCET) 5-325 MG tablet  Every 4 hours PRN     04/03/18 0313           Note:  This document was prepared using Dragon voice recognition software and may include unintentional dictation errors.    Darci CurrentBrown, [place] N, MD 04/03/18 [phone]

## 2018-04-04 ENCOUNTER — Ambulatory Visit (INDEPENDENT_AMBULATORY_CARE_PROVIDER_SITE_OTHER): Admitting: Surgery

## 2018-04-04 ENCOUNTER — Encounter: Payer: Self-pay | Admitting: Surgery

## 2018-04-04 ENCOUNTER — Other Ambulatory Visit: Payer: Self-pay

## 2018-04-04 VITALS — BP 120/73 | HR 81 | Temp 97.7°F | Resp 14 | Ht 59.0 in | Wt 140.0 lb

## 2018-04-04 DIAGNOSIS — K8020 Calculus of gallbladder without cholecystitis without obstruction: Secondary | ICD-10-CM | POA: Diagnosis not present

## 2018-04-04 NOTE — H&P (View-Only) (Signed)
04/04/2018  History of Present Illness: Alicia Sullivan is a 23 y.o. female presenting for evaluation of cholelithiasis.  She presented to the ED on 04/02/18 with RUQ abdominal pain and nausea.  She had been seen by our group back in 04/2017 with the same but her symptoms were much milder and less frequent, and conservative management was opted for at that point.  However, she reports that more recently since about a week prior to her ED visit, her pain has become more often and on 2/2, it was more severe.  She reports that since her ED visit, she has been having a low grade constant pain in the RUQ.   She has not tried to eat too heavy types of food, but mostly fruits and liquids and those have not caused any worsening pain.  Denies any fevers, chills, chest pain, shortness of breath, constipation or diarrhea.  The pain also radiates towards her back.  Past Medical History: Past Medical History:  Diagnosis Date  . Allergic contact dermatitis 11/10/2016  . Amenorrhea, secondary    pregnant  . Biliary colic   . Galactorrhea 01/08/2014  . Gallstones 09/22/2016  . 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 Irregular menses; hx   . IUD (intrauterine device) in place 03/13/2017  . 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 Recent urinary tract infection 02/18/2016   seen in ER  . Vaginal bleeding in pregnancy, first trimester 03/10/2016     Past Surgical History: Past Surgical History:  Procedure Laterality Date  . none      Home Medications: Prior to Admission medications   Medication Sig Start Date End Date Taking? Authorizing Provider  ondansetron (ZOFRAN ODT) 4 MG disintegrating tablet Take 1 tablet (4 mg total) by mouth every 8 (eight) hours as needed. 04/03/18  Yes Darci Current, MD  oxyCODONE-acetaminophen (PERCOCET) 5-325 MG tablet Take 1 tablet by mouth every 4 (four) hours as needed for up to 10 doses. 04/03/18  Yes Darci Current, MD    Allergies: No Known Allergies  Review of  Systems: Review of Systems  Constitutional: Negative for chills and fever.  Respiratory: Negative for shortness of breath.   Cardiovascular: Negative for chest pain.  Gastrointestinal: Positive for abdominal pain and nausea. Negative for constipation, diarrhea and vomiting.  Genitourinary: Negative for dysuria.  Musculoskeletal: Negative for myalgias.    Physical Exam BP 120/73   Pulse 81   Temp 97.7 F (36.5 C) (Oral)   Resp 14   Ht 4\' 11"  (1.499 m)   Wt 140 lb (63.5 kg)   LMP 03/20/2018 (Approximate)   SpO2 98%   BMI 28.28 kg/m  CONSTITUTIONAL: No acute distress HEENT:  Normocephalic, atraumatic, extraocular motion intact. RESPIRATORY:  Lungs are clear, and breath sounds are equal bilaterally. Normal respiratory effort without pathologic use of accessory muscles. CARDIOVASCULAR: Heart is regular without murmurs, gallops, or rubs. GI: The abdomen is soft, non-distended, with mild tenderness to palpation over the right upper quadrant.  Negative Murphy's sign. There were no palpable masses.  NEUROLOGIC:  Motor and sensation is grossly normal.  Cranial nerves are grossly intact. PSYCH:  Alert and oriented to person, place and time. Affect is normal.  Labs/Imaging: Labs 04/02/18: Na 139, K 3.7, Cl 103, CO2 28, BUN 9, creatinine 0.45, total bilirubin 0.7, AST 20, ALT 12, alkaline phosphatase 74, lipase 54.  WBC 11, hemoglobin 9.2, hematocrit 30.8, platelet count 391.  Ultrasound on 04/02/2018: IMPRESSION: Cholelithiasis. No additional changes to suggest acute cholecystitis. Similar appearance to  previous study.   Assessment and Plan: This is a [age] y.o. female with symptomatic cholelithiasis.  Discussed with the patient again the options for conservative management versus surgery.  Discussed that with conservative management we would recommend a low-fat diet but that this does not guarantee that her episodes would not happen again in the future.  She reports that given the increased  frequency and severity of her episodes, she would want to do surgery instead and remove her gallbladder.  Discussed with her the role of laparoscopic cholecystectomy including the risks of bleeding, infection, injury to surrounding structures and the possibility of having to convert to an open procedure.  Also discussed with her the postoperative expectations and restrictions particularly no heavy lifting or pushing of no more than 10 to 15 pounds for a period of 4 weeks.  She understands these and is willing to proceed.  She will be scheduled for 04/17/2018.  Face-to-face time spent with the patient and care providers was 25 minutes, with more than 50% of the time spent counseling, educating, and coordinating care of the patient.     Howie IllJose Luis Syncere Eble, MD [facility]

## 2018-04-04 NOTE — Progress Notes (Signed)
04/04/2018  History of Present Illness: Alicia Sullivan is a 23 y.o. female presenting for evaluation of cholelithiasis.  She presented to the ED on 04/02/18 with RUQ abdominal pain and nausea.  She had been seen by our group back in 04/2017 with the same but her symptoms were much milder and less frequent, and conservative management was opted for at that point.  However, she reports that more recently since about a week prior to her ED visit, her pain has become more often and on 2/2, it was more severe.  She reports that since her ED visit, she has been having a low grade constant pain in the RUQ.   She has not tried to eat too heavy types of food, but mostly fruits and liquids and those have not caused any worsening pain.  Denies any fevers, chills, chest pain, shortness of breath, constipation or diarrhea.  The pain also radiates towards her back.  Past Medical History: Past Medical History:  Diagnosis Date  . Allergic contact dermatitis 11/10/2016  . Amenorrhea, secondary    pregnant  . Biliary colic   . Galactorrhea 01/08/2014  . Gallstones 09/22/2016  . 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 Irregular menses; hx   . IUD (intrauterine device) in place 03/13/2017  . 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 Recent urinary tract infection 02/18/2016   seen in ER  . Vaginal bleeding in pregnancy, first trimester 03/10/2016     Past Surgical History: Past Surgical History:  Procedure Laterality Date  . none      Home Medications: Prior to Admission medications   Medication Sig Start Date End Date Taking? Authorizing Provider  ondansetron (ZOFRAN ODT) 4 MG disintegrating tablet Take 1 tablet (4 mg total) by mouth every 8 (eight) hours as needed. 04/03/18  Yes Darci Current, MD  oxyCODONE-acetaminophen (PERCOCET) 5-325 MG tablet Take 1 tablet by mouth every 4 (four) hours as needed for up to 10 doses. 04/03/18  Yes Darci Current, MD    Allergies: No Known Allergies  Review of  Systems: Review of Systems  Constitutional: Negative for chills and fever.  Respiratory: Negative for shortness of breath.   Cardiovascular: Negative for chest pain.  Gastrointestinal: Positive for abdominal pain and nausea. Negative for constipation, diarrhea and vomiting.  Genitourinary: Negative for dysuria.  Musculoskeletal: Negative for myalgias.    Physical Exam BP 120/73   Pulse 81   Temp 97.7 F (36.5 C) (Oral)   Resp 14   Ht 4\' 11"  (1.499 m)   Wt 140 lb (63.5 kg)   LMP 03/20/2018 (Approximate)   SpO2 98%   BMI 28.28 kg/m  CONSTITUTIONAL: No acute distress HEENT:  Normocephalic, atraumatic, extraocular motion intact. RESPIRATORY:  Lungs are clear, and breath sounds are equal bilaterally. Normal respiratory effort without pathologic use of accessory muscles. CARDIOVASCULAR: Heart is regular without murmurs, gallops, or rubs. GI: The abdomen is soft, non-distended, with mild tenderness to palpation over the right upper quadrant.  Negative Murphy's sign. There were no palpable masses.  NEUROLOGIC:  Motor and sensation is grossly normal.  Cranial nerves are grossly intact. PSYCH:  Alert and oriented to person, place and time. Affect is normal.  Labs/Imaging: Labs 04/02/18: Na 139, K 3.7, Cl 103, CO2 28, BUN 9, creatinine 0.45, total bilirubin 0.7, AST 20, ALT 12, alkaline phosphatase 74, lipase 54.  WBC 11, hemoglobin 9.2, hematocrit 30.8, platelet count 391.  Ultrasound on 04/02/2018: IMPRESSION: Cholelithiasis. No additional changes to suggest acute cholecystitis. Similar appearance to  previous study.   Assessment and Plan: This is a 22 y.o. female with symptomatic cholelithiasis.  Discussed with the patient again the options for conservative management versus surgery.  Discussed that with conservative management we would recommend a low-fat diet but that this does not guarantee that her episodes would not happen again in the future.  She reports that given the increased  frequency and severity of her episodes, she would want to do surgery instead and remove her gallbladder.  Discussed with her the role of laparoscopic cholecystectomy including the risks of bleeding, infection, injury to surrounding structures and the possibility of having to convert to an open procedure.  Also discussed with her the postoperative expectations and restrictions particularly no heavy lifting or pushing of no more than 10 to 15 pounds for a period of 4 weeks.  She understands these and is willing to proceed.  She will be scheduled for 04/17/2018.  Face-to-face time spent with the patient and care providers was 25 minutes, with more than 50% of the time spent counseling, educating, and coordinating care of the patient.     Alicia Sullivan Alicia Deshunda Thackston, MD [facility]    

## 2018-04-04 NOTE — Patient Instructions (Addendum)
You have requested to have your gallbladder removed. This will be done on 04/17/2018   at [facility] with Dr. Henrene DodgeJose Piscoya.  You will most likely be out of work 1-2 weeks for this surgery. You will return after your post-op appointment with a lifting restriction for approximately 4 more weeks.  You will be able to eat anything you would like to following surgery. But, start by eating a bland diet and advance this as tolerated. The Gallbladder diet is below, please go as closely by this diet as possible prior to surgery to avoid any further attacks.  Please see the (blue)pre-care form that you have been given today. If you have any questions, please call our office.  Laparoscopic Cholecystectomy Laparoscopic cholecystectomy is surgery to remove the gallbladder. The gallbladder is located in the upper right part of the abdomen, behind the liver. It is a storage sac for bile, which is produced in the liver. Bile aids in the digestion and absorption of fats. Cholecystectomy is often done for inflammation of the gallbladder (cholecystitis). This condition is usually caused by a buildup of gallstones (cholelithiasis) in the gallbladder. Gallstones can block the flow of bile, and that can result in inflammation and pain. In severe cases, emergency surgery may be required. If emergency surgery is not required, you will have time to prepare for the procedure. Laparoscopic surgery is an alternative to open surgery. Laparoscopic surgery has a shorter recovery time. Your common bile duct may also need to be examined during the procedure. If stones are found in the common bile duct, they may be removed. LET [facility] PROVIDER KNOW ABOUT:  Any allergies you have.  All medicines you are taking, including vitamins, herbs, eye drops, creams, and over-the-counter medicines.  Previous problems you or members of your family have had with the use of anesthetics.  Any blood disorders you have.  Previous  surgeries you have had.    Any medical conditions you have. RISKS AND COMPLICATIONS Generally, this is a safe procedure. However, problems may occur, including:  Infection.  Bleeding.  Allergic reactions to medicines.  Damage to other structures or organs.  A stone remaining in the common bile duct.  A bile leak from the cyst duct that is clipped when your gallbladder is removed.  The need to convert to open surgery, which requires a larger incision in the abdomen. This may be necessary if your surgeon thinks that it is not safe to continue with a laparoscopic procedure. BEFORE THE PROCEDURE  Ask your health care provider about:  Changing or stopping your regular medicines. This is especially important if you are taking diabetes medicines or blood thinners.  Taking medicines such as aspirin and ibuprofen. These medicines can thin your blood. Do not take these medicines before your procedure if your health care provider instructs you not to.  Follow instructions from your health care provider about eating or drinking restrictions.  Let your health care provider know if you develop a cold or an infection before surgery.  Plan to have someone take you home after the procedure.  Ask your health care provider how your surgical site will be marked or identified.  You may be given antibiotic medicine to help prevent infection. PROCEDURE  To reduce your risk of infection:  Your health care team will wash or sanitize their hands.  Your skin will be washed with soap.  An IV tube may be inserted into one of your veins.  You will be given a  medicine to make you fall asleep (general anesthetic).  A breathing tube will be placed in your mouth.  The surgeon will make several small cuts (incisions) in your abdomen.  A thin, lighted tube (laparoscope) that has a tiny camera on the end will be inserted through one of the small incisions. The camera on the laparoscope will send a  picture to a TV screen (monitor) in the operating room. This will give the surgeon a good view inside your abdomen.  A gas will be pumped into your abdomen. This will expand your abdomen to give the surgeon more room to perform the surgery.  Other tools that are needed for the procedure will be inserted through the other incisions. The gallbladder will be removed through one of the incisions.  After your gallbladder has been removed, the incisions will be closed with stitches (sutures), staples, or skin glue.  Your incisions may be covered with a bandage (dressing). The procedure may vary among health care providers and hospitals. AFTER THE PROCEDURE  Your blood pressure, heart rate, breathing rate, and blood oxygen level will be monitored often until the medicines you were given have worn off.  You will be given medicines as needed to control your pain.   This information is not intended to replace advice given to you by your health care provider. Make sure you discuss any questions you have with your health care provider.   Document Released: 02/15/2005 Document Revised: 11/06/2014 Document Reviewed: 09/27/2012 Elsevier Interactive Patient Education 2016 [place] Diet for Gallbladder Conditions A low-fat diet can be helpful if you have pancreatitis or a gallbladder condition. With these conditions, your pancreas and gallbladder have trouble digesting fats. A healthy eating plan with less fat will help rest your pancreas and gallbladder and reduce your symptoms. WHAT DO I NEED TO KNOW ABOUT THIS DIET?  Eat a low-fat diet.  Reduce your fat intake to less than 20-30% of your total daily calories. This is less than 50-60 g of fat per day.  Remember that you need some fat in your diet. Ask your dietician what your daily goal should be.  Choose nonfat and low-fat healthy foods. Look for the words "nonfat," "low fat," or "fat free."  As a guide, look on the label and  choose foods with less than 3 g of fat per serving. Eat only one serving.  Avoid alcohol.  Do not smoke. If you need help quitting, talk with your health care provider.  Eat small frequent meals instead of three large heavy meals. WHAT FOODS CAN I EAT? Grains Include healthy grains and starches such as potatoes, wheat bread, fiber-rich cereal, and brown rice. Choose whole grain options whenever possible. In adults, whole grains should account for 45-65% of your daily calories.  Fruits and Vegetables Eat plenty of fruits and vegetables. Fresh fruits and vegetables add fiber to your diet. Meats and Other Protein Sources Eat lean meat such as chicken and pork. Trim any fat off of meat before cooking it. Eggs, fish, and beans are other sources of protein. In adults, these foods should account for 10-35% of your daily calories. Dairy Choose low-fat milk and dairy options. Dairy includes fat and protein, as well as calcium.  Fats and Oils Limit high-fat foods such as fried foods, sweets, baked goods, sugary drinks.  Other Creamy sauces and condiments, such as mayonnaise, can add extra fat. Think about whether or not you need to use them, or use smaller amounts or  low fat options. WHAT FOODS ARE NOT RECOMMENDED?  High fat foods, such as:  [employer].  Ice cream.  [place] toast.  Sweet rolls.  Pizza.  Cheese bread.  Foods covered with batter, butter, creamy sauces, or cheese.  Fried foods.  Sugary drinks and desserts.  Foods that cause gas or bloating   This information is not intended to replace advice given to you by your health care provider. Make sure you discuss any questions you have with your health care provider.   Document Released: 02/20/2013 Document Reviewed: 02/20/2013 Elsevier Interactive Patient Education [employer].

## 2018-04-05 ENCOUNTER — Telehealth: Payer: Self-pay | Admitting: *Deleted

## 2018-04-05 NOTE — Telephone Encounter (Signed)
Patient's surgery has been scheduled for 04-17-18 at [facility] with Dr. Aleen Campi.  The patient is aware she will be contacted by the Pre-Admission Testing Department to complete a phone interview sometime between 1 and 5 pm on 04-12-18.  Patient aware to have a driver, be NPO after midnight, and to check in at [facility] in the [facility]. Patient aware she will call on 04-14-18 between 1 an 3 pm to find out arrival time day of surgery.   The patient is aware to call the office should she have further questions.

## 2018-04-12 ENCOUNTER — Inpatient Hospital Stay: Admission: RE | Admit: 2018-04-12 | Source: Ambulatory Visit

## 2018-04-13 ENCOUNTER — Telehealth: Payer: Self-pay | Admitting: *Deleted

## 2018-04-13 ENCOUNTER — Other Ambulatory Visit: Payer: Self-pay

## 2018-04-13 ENCOUNTER — Encounter
Admission: RE | Admit: 2018-04-13 | Discharge: 2018-04-13 | Disposition: A | Discharge status: Home / Self Care | Source: Ambulatory Visit | Attending: Surgery | Admitting: Surgery

## 2018-04-13 HISTORY — DX: Anemia, unspecified: D64.9

## 2018-04-13 NOTE — Patient Instructions (Signed)
Your procedure is scheduled on: 04-17-18 Report to Same Day Surgery 2nd floor medical mall [facility] Entrance-take elevator on left to 2nd floor.  Check in with surgery information desk.) To find out your arrival time please call [phone] between 1PM - 3PM on 04-14-18  Remember: Instructions that are not followed completely may result in serious medical risk, up to and including death, or upon the discretion of your surgeon and anesthesiologist your surgery may need to be rescheduled.    _x___ 1. Do not eat food after midnight the night before your procedure. You may drink clear liquids up to 2 hours before you are scheduled to arrive at the hospital for your procedure.  Do not drink clear liquids within 2 hours of your scheduled arrival to the hospital.  Clear liquids include  --Water or Apple juice without pulp  --Clear carbohydrate beverage such as ClearFast or Gatorade  --Black Coffee or Clear Tea (No milk, no creamers, do not add anything to  the coffee or Tea   ____Ensure clear carbohydrate drink on the way to the hospital for bariatric patients  ____Ensure clear carbohydrate drink 3 hours before surgery for Dr Rutherford Nail patients if physician instructed.   No gum chewing or hard candies.     __x__ 2. No Alcohol for 24 hours before or after surgery.   __x__3. No Smoking or e-cigarettes for 24 prior to surgery.  Do not use any chewable tobacco products for at least 6 hour prior to surgery   ____  4. Bring all medications with you on the day of surgery if instructed.    __x__ 5. Notify your doctor if there is any change in your medical condition     (cold, fever, infections).    x___6. On the morning of surgery brush your teeth with toothpaste and water.  You may rinse your mouth with mouth wash if you wish.  Do not swallow any toothpaste or mouthwash.   Do not wear jewelry, make-up, hairpins, clips or nail polish.  Do not wear lotions, powders, or perfumes. You may wear  deodorant.  Do not shave 48 hours prior to surgery. Men may shave face and neck.  Do not bring valuables to the hospital.    [facility] is not responsible for any belongings or valuables.               Contacts, dentures or bridgework may not be worn into surgery.  Leave your suitcase in the car. After surgery it may be brought to your room.  For patients admitted to the hospital, discharge time is determined by your                       treatment team.  _  Patients discharged the day of surgery will not be allowed to drive home.  You will need someone to drive you home and stay with you the night of your procedure.    Please read over the following fact sheets that you were given:   [facility] Preparing for Surgery and or MRSA Information   _x___ Take anti-hypertensive listed below, cardiac, seizure, asthma,     anti-reflux and psychiatric medicines. These include:  1. You may take percocet am of surgery if needed with a small sip of water  2.  3.  4.  5.  6.  ____Fleets enema or Magnesium Citrate as directed.   ____ Use CHG Soap or sage wipes as directed on instruction sheet  ____ Use inhalers on the day of surgery and bring to hospital day of surgery  ____ Stop Metformin and Janumet 2 days prior to surgery.    ____ Take 1/2 of usual insulin dose the night before surgery and none on the morning     surgery.   ____ Follow recommendations from Cardiologist, Pulmonologist or PCP regarding          stopping Aspirin, Coumadin, Plavix ,Eliquis, Effient, or Pradaxa, and Pletal.  X____Stop Anti-inflammatories such as Advil, Aleve, Ibuprofen, Motrin, Naproxen, Naprosyn, Goodies powders or aspirin products now-OK to take Tylenol or percocet if needed   _x___ Stop supplements until after surgery-stop biotin now   ____ Bring C-Pap to the hospital.

## 2018-04-13 NOTE — Telephone Encounter (Signed)
Per Herbert Seta with Pre-admission, she has been unable to reach patient to complete a phone interview.   I did call and speak with the patient and instructed her to call Herbert Seta (Phone: [phone]) back so surgery for 04-17-18 is not delayed.   Patient verbalizes understanding.   Secure chat message sent to Herbert Seta to notify her of the above.

## 2018-04-16 IMAGING — US US ABDOMEN LIMITED
1 series · 14 of 25 positions shown · non-contrast
Comparison: Prior CT of the abdomen and pelvis 02/18/2016; recent
obstetrical ultrasound 09/21/2016

CLINICAL DATA: 21-year-old approximately 33 week gravid female with
right upper quadrant pain, nausea and vomiting

EXAM:
ULTRASOUND ABDOMEN LIMITED RIGHT UPPER QUADRANT

[Series 1: us abdomen limited · 0.22mm/px · 58 acquisitions, 14 frames shown]
[im 1/58]
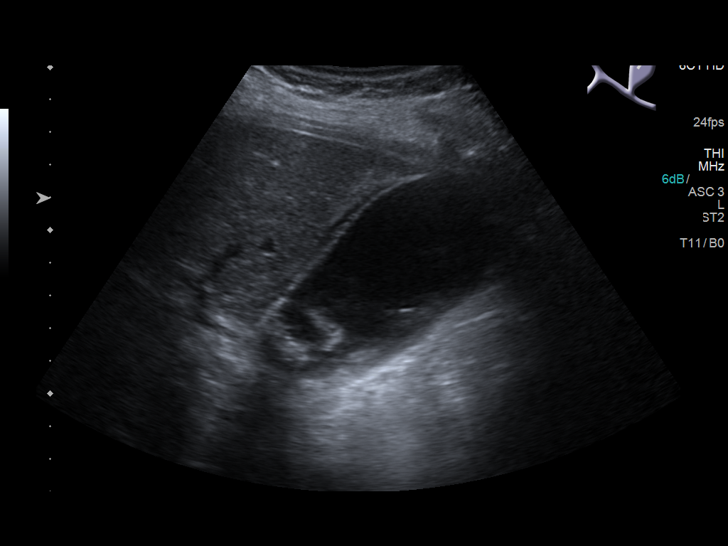
[im 5/58]
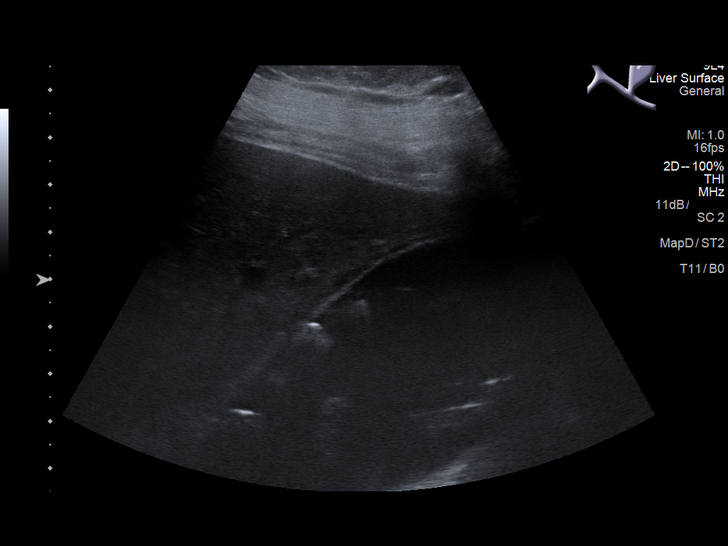
[im 10/58]
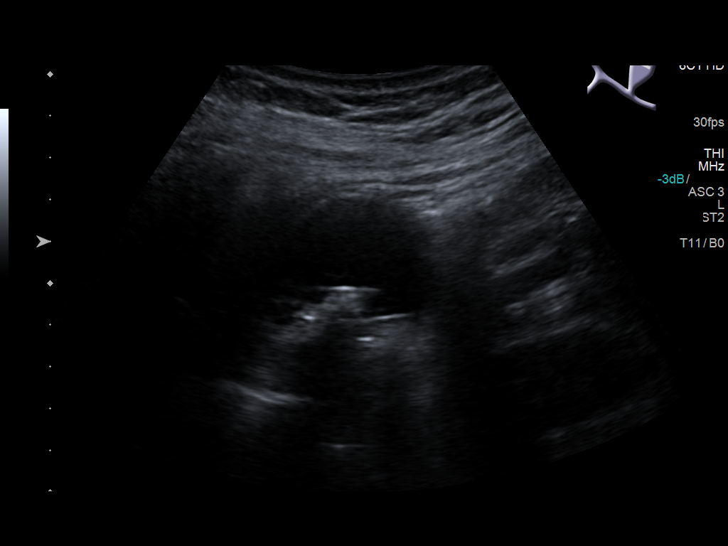
[im 15/58]
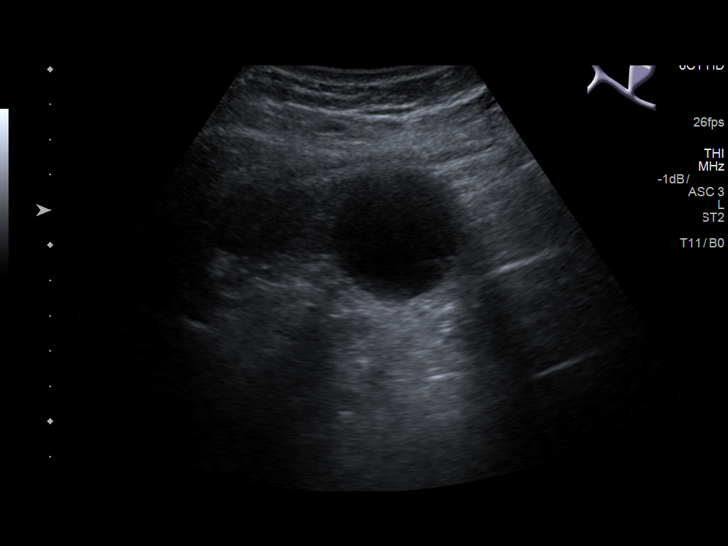
[im 20/58]
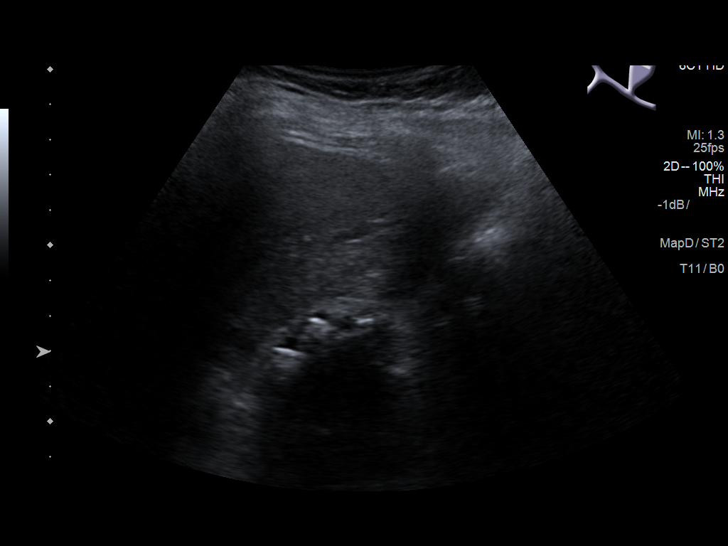
[im 22/58]
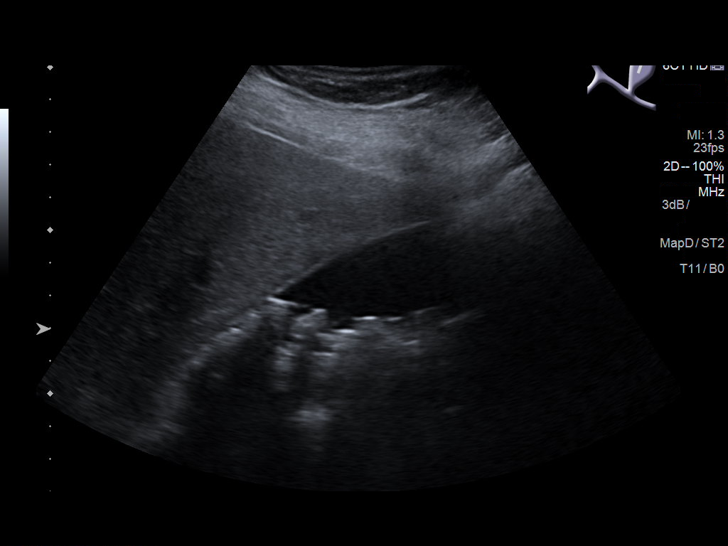
[im 27/58]
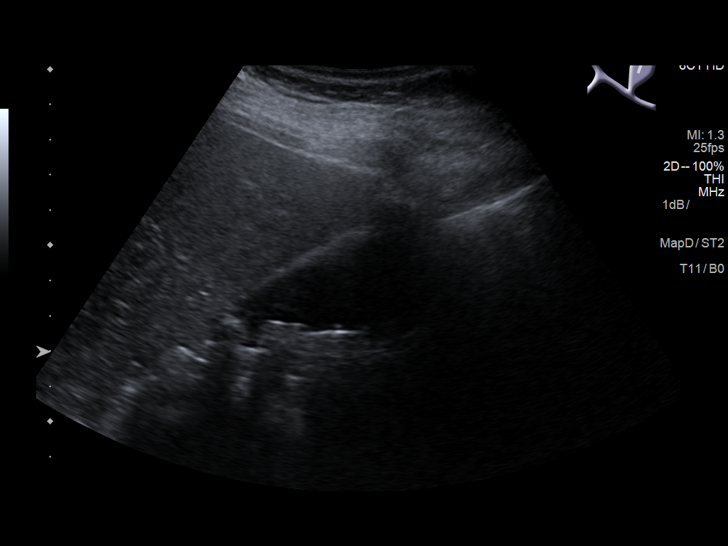
[im 31/58]
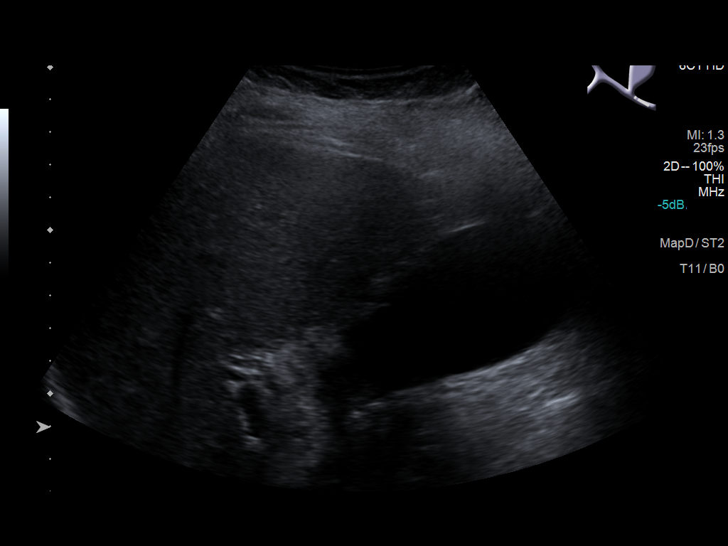
[im 36/58]
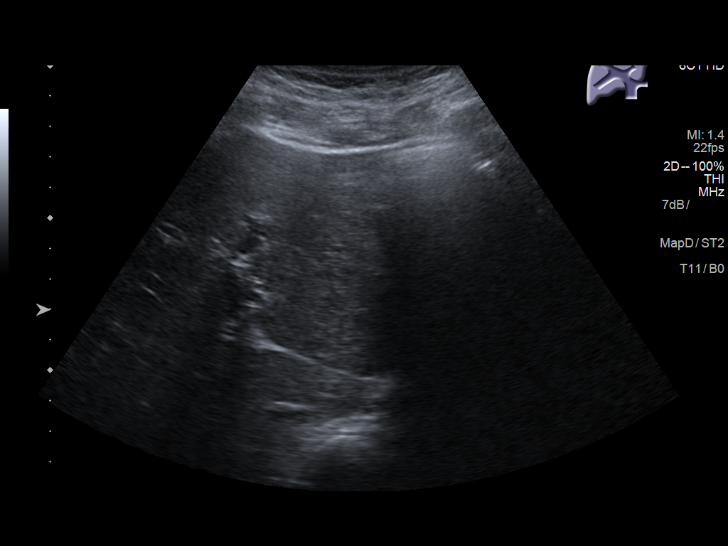
[im 39/58]
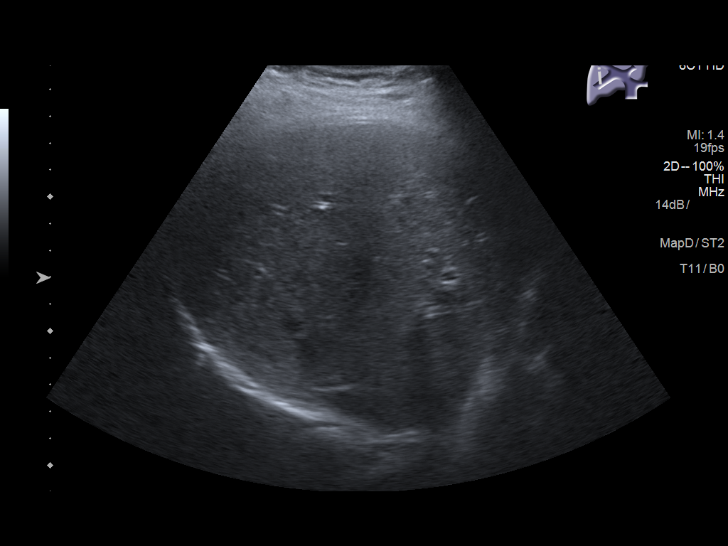
[im 43/58]
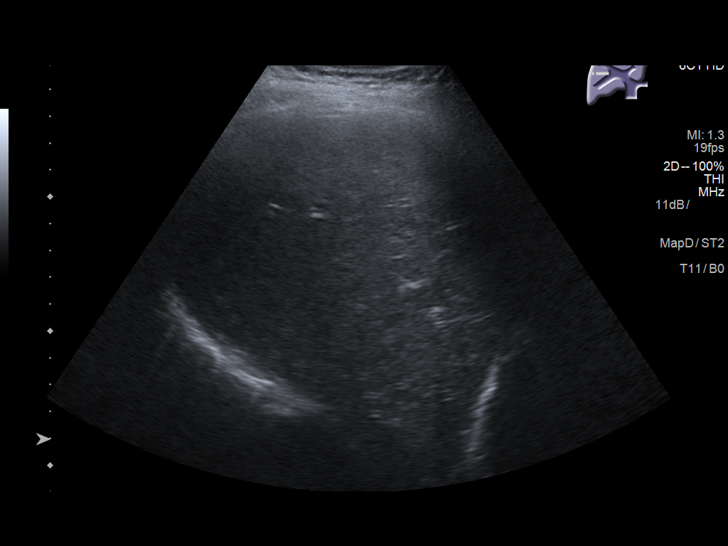
[im 48/58]
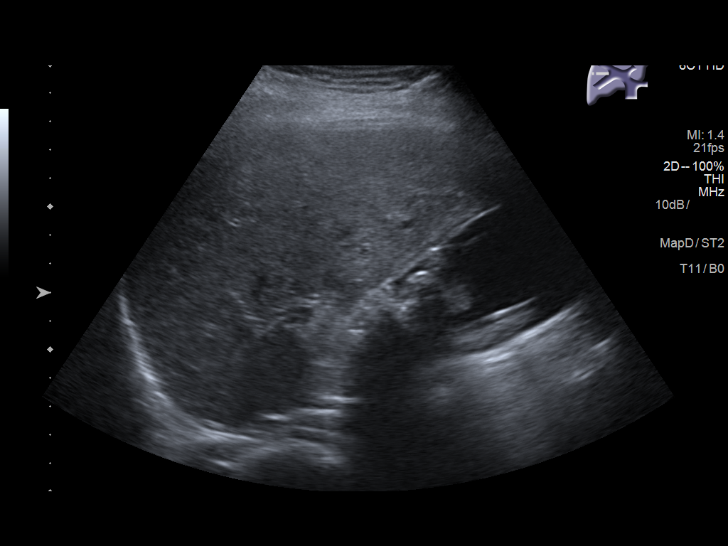
[im 53/58]
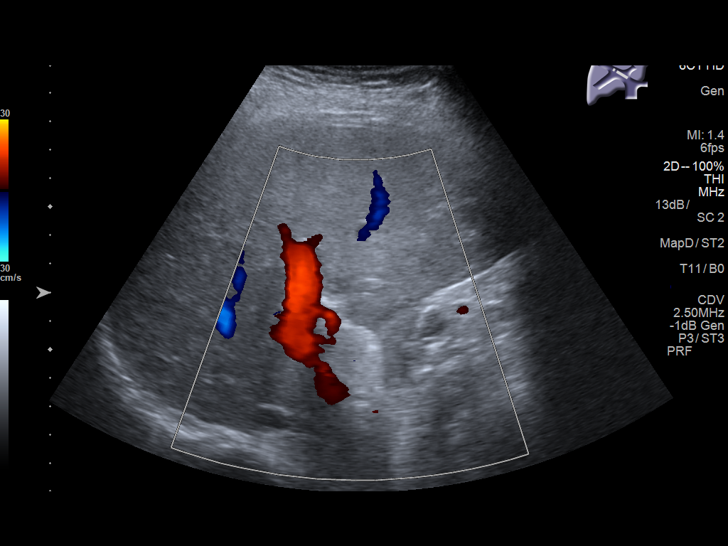
[im 58/58]
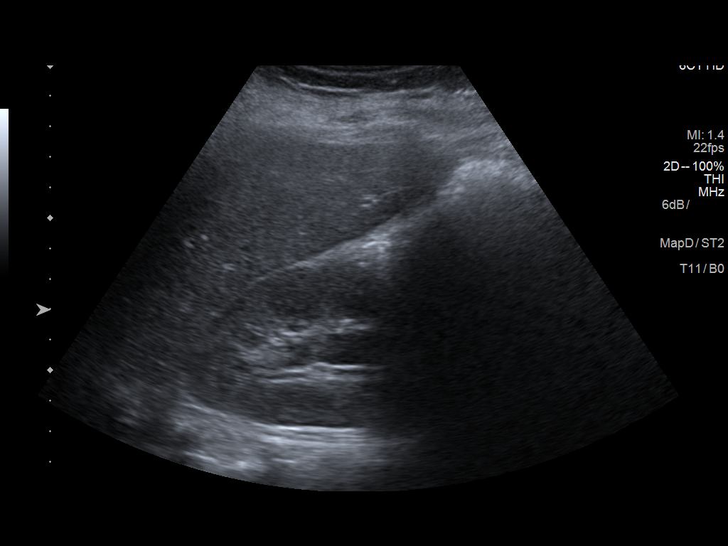

[14 of 25 positions shown; findings below may reference images not displayed]

FINDINGS: Gallbladder:

Numerous mobile echogenic foci layer within the gallbladder lumen.
The echogenic foci demonstrate posterior acoustic shadowing
consistent with stones. The gallbladder wall is borderline thickened
at 3 mm and appears edematous. No definite pericholecystic fluid.
Per the sonographer, the sonographic Murphy sign was negative.

Common bile duct:

Diameter: Normal at 3 mm.

Liver:

No focal lesion identified. Within normal limits in parenchymal
echogenicity. The main portal vein is patent with normal hepatopetal
flow.

Other:  Mild right-sided hydronephrosis.
IMPRESSION: 1. Cholelithiasis with mild gallbladder wall edema but no
pericholecystic fluid or sonographic Murphy sign.
2. Incidentally imaged mild right-sided hydronephrosis.

## 2018-04-16 MED ORDER — CEFAZOLIN SODIUM-DEXTROSE 2-4 GM/100ML-% IV SOLN: 2.0000 g | INTRAVENOUS | Status: DC

## 2018-04-17 ENCOUNTER — Encounter: Payer: Self-pay | Admitting: Certified Registered"

## 2018-04-17 ENCOUNTER — Telehealth: Payer: Self-pay

## 2018-04-17 ENCOUNTER — Encounter: Admission: RE | Payer: Self-pay | Source: Home / Self Care

## 2018-04-17 ENCOUNTER — Ambulatory Visit: Admission: RE | Admit: 2018-04-17 | Source: Home / Self Care | Admitting: Surgery

## 2018-04-17 SURGERY — LAPAROSCOPIC CHOLECYSTECTOMY
Anesthesia: General

## 2018-04-17 NOTE — Telephone Encounter (Signed)
Patient was supposed to have surgery today with Dr Aleen Campi. Due to time constraints in the OR she will need to be rescheduled. Unable to leave a message for the patient to call back about rescheduling her surgery. We can do this on Monday the 24th or Tuesday the 25th.

## 2018-04-18 IMAGING — CT CT RENAL STONE PROTOCOL
3 of 4 series · 9 of 46 positions shown, 16 images · non-contrast
Comparison: None.

CLINICAL DATA: Hematuria and dysuria today.

EXAM:
CT ABDOMEN AND PELVIS WITHOUT CONTRAST
TECHNIQUE: Multidetector CT imaging of the abdomen and pelvis was performed
following the standard protocol without IV contrast.

[Series 4: lung bases · axial · 0.73mm/px · z∈[-548,-468]mm · 5 of 26 slices shown, 10 images]
[im 5/26  soft-tissue]
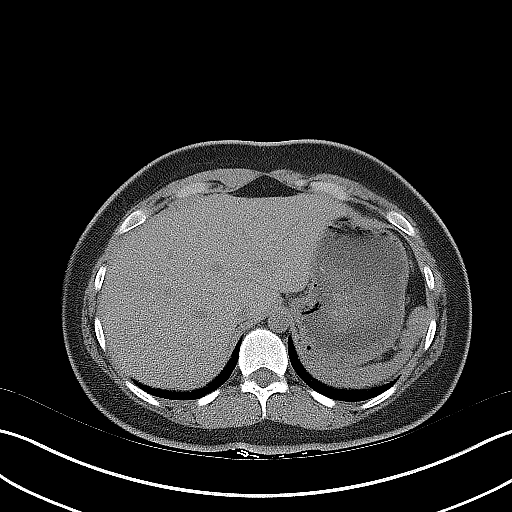
[im 5/26  bone]
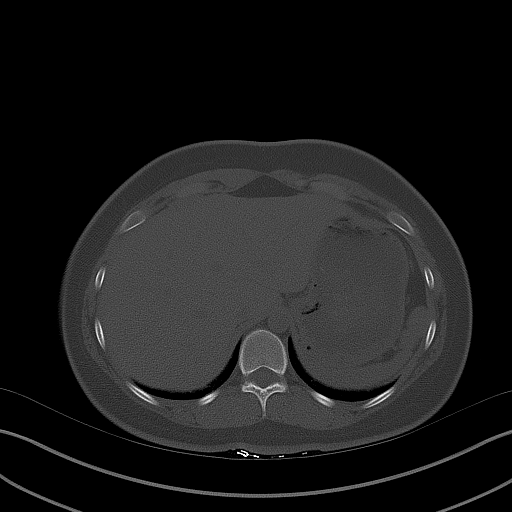
[im 9/26  soft-tissue]
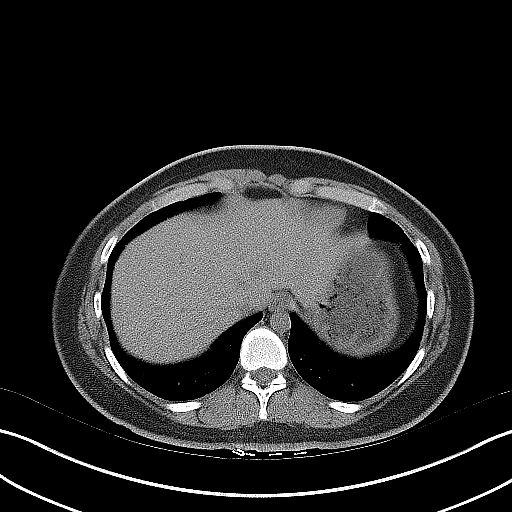
[im 9/26  lung]
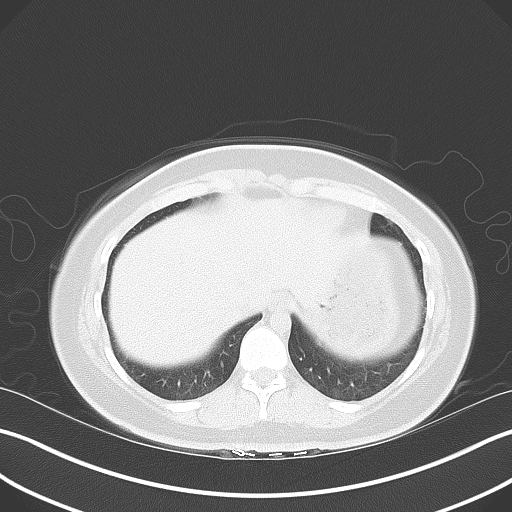
[im 13/26  soft-tissue]
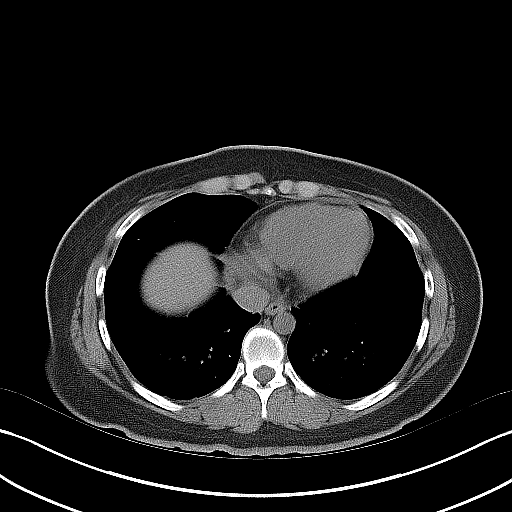
[im 13/26  lung]
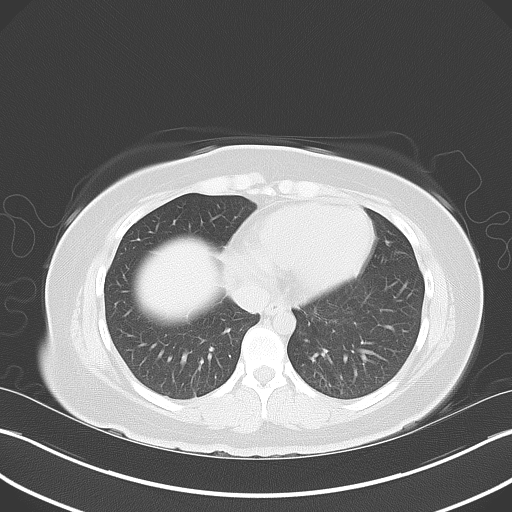
[im 17/26  soft-tissue]
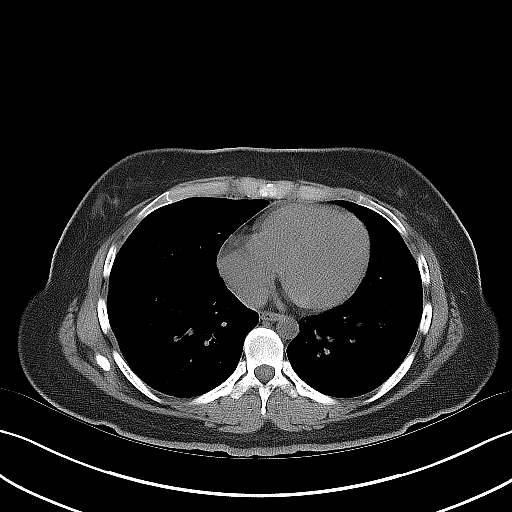
[im 17/26  lung]
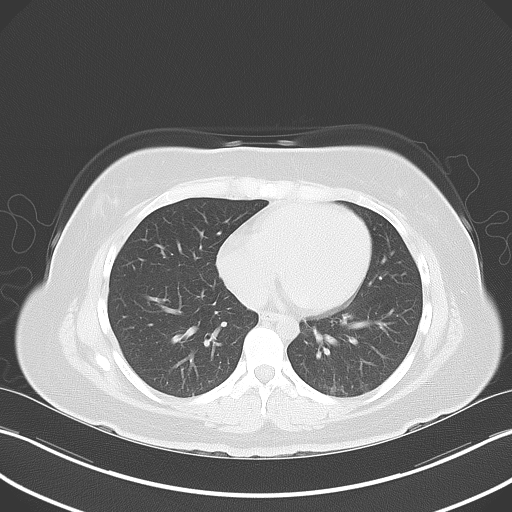
[im 21/26  soft-tissue]
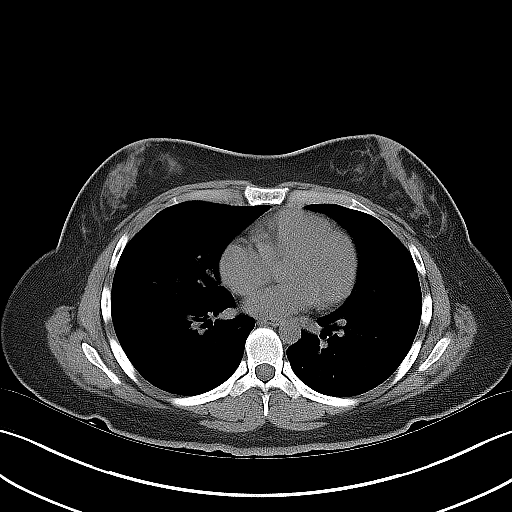
[im 21/26  lung]
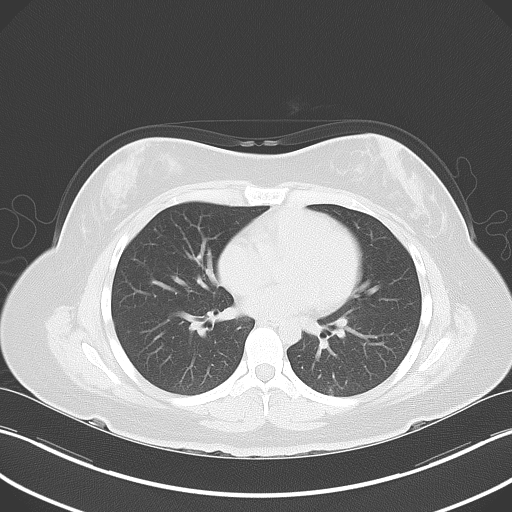

[Series 5: coronal · coronal · 0.70mm/px · 3 of 111 slices shown, 4 images]
[im 37/111  soft-tissue]
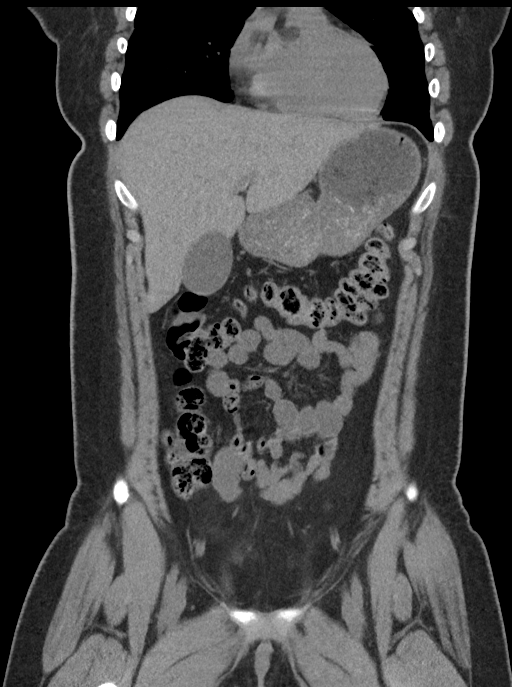
[im 49/111  soft-tissue]
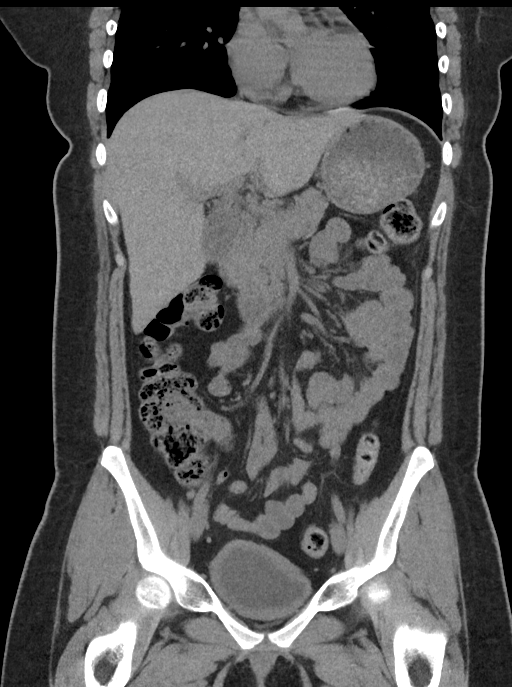
[im 49/111  bone]
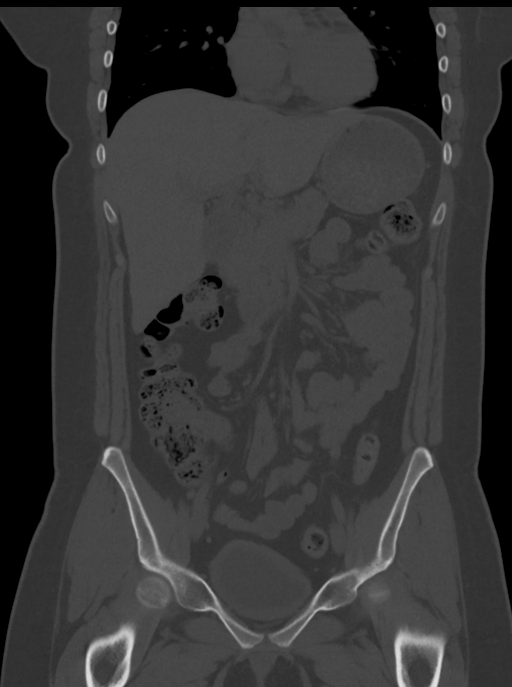
[im 62/111  soft-tissue]
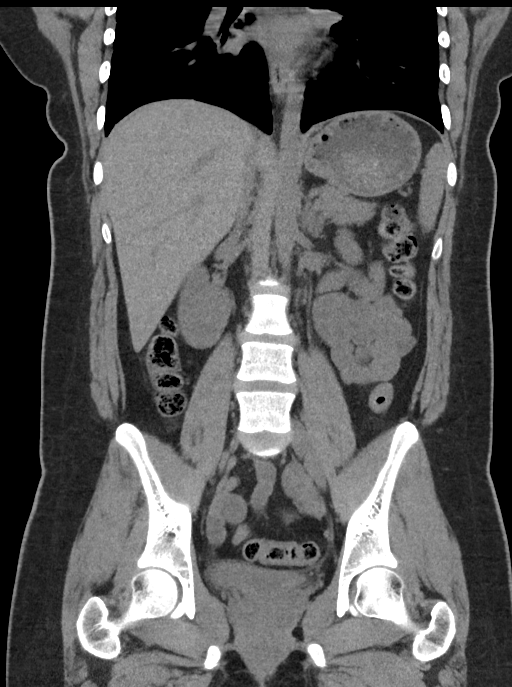

[Series 6: sagittal · sagittal · 0.56mm/px · 1 of 152 slices shown, 2 images]
[im 51/152  soft-tissue]
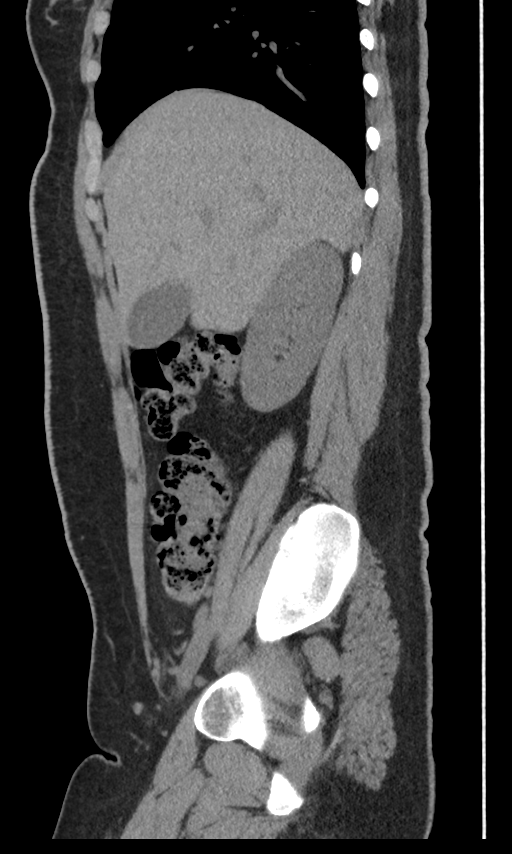
[im 51/152  bone]
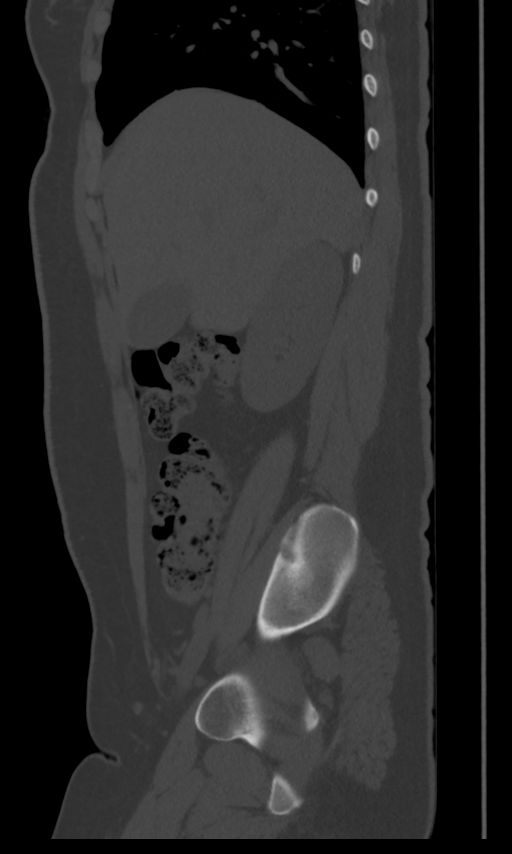

[9 of 46 positions shown; findings below may reference images not displayed]

FINDINGS: Lower chest: No acute abnormality.

Hepatobiliary: No focal liver abnormality is seen. No gallstones,
gallbladder wall thickening, or biliary dilatation.

Pancreas: Unremarkable. No pancreatic ductal dilatation or
surrounding inflammatory changes.

Spleen: Normal in size without focal abnormality.

Adrenals/Urinary Tract: Adrenal glands are unremarkable. Kidneys are
normal, without renal calculi, focal lesion, or hydronephrosis.
Bladder is unremarkable.

Stomach/Bowel: Stomach is within normal limits. Appendix is normal.
No evidence of bowel wall thickening, distention, or inflammatory
changes.

Vascular/Lymphatic: No significant vascular findings are present. No
enlarged abdominal or pelvic lymph nodes.

Reproductive: Uterus and bilateral adnexa are unremarkable.

Other: No acute inflammatory changes in the abdomen or pelvis. No
ascites.

Musculoskeletal: No acute or significant osseous findings.
IMPRESSION: No significant abnormality.

## 2018-04-18 NOTE — Telephone Encounter (Signed)
Spoke with patient about rescheduling her surgery. The patient is rescheduled for surgery at [facility] with Dr Aleen Campi on 04/24/18. She is aware of surgery instructions and to call the Friday before to get her arrival time.

## 2018-04-24 ENCOUNTER — Ambulatory Visit
Admission: RE | Admit: 2018-04-24 | Discharge: 2018-04-24 | Disposition: A | Discharge status: Home / Self Care | Attending: Surgery | Admitting: Surgery

## 2018-04-24 ENCOUNTER — Ambulatory Visit: Admitting: Certified Registered Nurse Anesthetist

## 2018-04-24 ENCOUNTER — Encounter
Admission: RE | Disposition: A | Discharge status: Home / Self Care | Payer: Self-pay | Source: Home / Self Care | Attending: Surgery

## 2018-04-24 ENCOUNTER — Other Ambulatory Visit: Payer: Self-pay

## 2018-04-24 ENCOUNTER — Encounter: Payer: Self-pay | Admitting: Certified Registered Nurse Anesthetist

## 2018-04-24 DIAGNOSIS — K8020 Calculus of gallbladder without cholecystitis without obstruction: Secondary | ICD-10-CM | POA: Diagnosis not present

## 2018-04-24 DIAGNOSIS — K8010 Calculus of gallbladder with chronic cholecystitis without obstruction: Secondary | ICD-10-CM | POA: Insufficient documentation

## 2018-04-24 HISTORY — PX: CHOLECYSTECTOMY: SHX55

## 2018-04-24 LAB — POCT PREGNANCY, URINE: PREG TEST UR: NEGATIVE

## 2018-04-24 SURGERY — LAPAROSCOPIC CHOLECYSTECTOMY
Anesthesia: General

## 2018-04-24 MED ORDER — OXYCODONE HCL 5 MG PO TABS
5.0000 mg | ORAL_TABLET | Freq: Once | ORAL | Status: AC
  Administered 2018-04-24: 5 mg via ORAL

## 2018-04-24 MED ORDER — ACETAMINOPHEN 500 MG PO TABS
1000.0000 mg | ORAL_TABLET | ORAL | Status: AC
  Administered 2018-04-24: 1000 mg via ORAL

## 2018-04-24 MED ORDER — BUPIVACAINE-EPINEPHRINE (PF) 0.25% -1:200000 IJ SOLN
INTRAMUSCULAR | Status: DC | PRN
  Administered 2018-04-24: 30 mL

## 2018-04-24 MED ORDER — GABAPENTIN 300 MG PO CAPS
ORAL_CAPSULE | ORAL | Status: AC
  Filled 2018-04-24: qty 1

## 2018-04-24 MED ORDER — KETOROLAC TROMETHAMINE 30 MG/ML IJ SOLN
15.0000 mg | Freq: Once | INTRAMUSCULAR | Status: AC
  Administered 2018-04-24: 15 mg via INTRAVENOUS

## 2018-04-24 MED ORDER — FENTANYL CITRATE (PF) 100 MCG/2ML IJ SOLN
INTRAMUSCULAR | Status: AC
  Administered 2018-04-24: 50 ug via INTRAVENOUS
  Filled 2018-04-24: qty 2

## 2018-04-24 MED ORDER — FENTANYL CITRATE (PF) 100 MCG/2ML IJ SOLN
INTRAMUSCULAR | Status: DC | PRN
  Administered 2018-04-24: 100 ug via INTRAVENOUS
  Administered 2018-04-24 (×3): 50 ug via INTRAVENOUS

## 2018-04-24 MED ORDER — FENTANYL CITRATE (PF) 250 MCG/5ML IJ SOLN
INTRAMUSCULAR | Status: AC
  Filled 2018-04-24: qty 5

## 2018-04-24 MED ORDER — PROMETHAZINE HCL 25 MG/ML IJ SOLN
6.2500 mg | INTRAMUSCULAR | Status: DC | PRN
  Administered 2018-04-24: 6.25 mg via INTRAVENOUS

## 2018-04-24 MED ORDER — BUPIVACAINE-EPINEPHRINE (PF) 0.25% -1:200000 IJ SOLN
INTRAMUSCULAR | Status: AC
  Filled 2018-04-24: qty 30

## 2018-04-24 MED ORDER — LACTATED RINGERS IV SOLN
INTRAVENOUS | Status: DC
  Administered 2018-04-24: 10:00:00 via INTRAVENOUS

## 2018-04-24 MED ORDER — ONDANSETRON HCL 4 MG/2ML IJ SOLN
INTRAMUSCULAR | Status: DC | PRN
  Administered 2018-04-24: 4 mg via INTRAVENOUS

## 2018-04-24 MED ORDER — ROCURONIUM BROMIDE 100 MG/10ML IV SOLN
INTRAVENOUS | Status: DC | PRN
  Administered 2018-04-24 (×2): 10 mg via INTRAVENOUS
  Administered 2018-04-24: 40 mg via INTRAVENOUS

## 2018-04-24 MED ORDER — FAMOTIDINE 20 MG PO TABS
20.0000 mg | ORAL_TABLET | Freq: Once | ORAL | Status: AC
  Administered 2018-04-24: 20 mg via ORAL

## 2018-04-24 MED ORDER — PHENYLEPHRINE HCL 10 MG/ML IJ SOLN
INTRAMUSCULAR | Status: DC | PRN
  Administered 2018-04-24 (×2): 100 ug via INTRAVENOUS

## 2018-04-24 MED ORDER — CHLORHEXIDINE GLUCONATE CLOTH 2 % EX PADS
6.0000 | MEDICATED_PAD | Freq: Once | CUTANEOUS | Status: AC
  Administered 2018-04-24: 6 via TOPICAL

## 2018-04-24 MED ORDER — CHLORHEXIDINE GLUCONATE CLOTH 2 % EX PADS: 6.0000 | MEDICATED_PAD | Freq: Once | CUTANEOUS | Status: DC

## 2018-04-24 MED ORDER — SODIUM CHLORIDE FLUSH 0.9 % IV SOLN
INTRAVENOUS | Status: AC
  Filled 2018-04-24: qty 10

## 2018-04-24 MED ORDER — GABAPENTIN 300 MG PO CAPS
300.0000 mg | ORAL_CAPSULE | ORAL | Status: AC
  Administered 2018-04-24: 300 mg via ORAL

## 2018-04-24 MED ORDER — CEFAZOLIN SODIUM-DEXTROSE 2-4 GM/100ML-% IV SOLN
2.0000 g | Freq: Once | INTRAVENOUS | Status: AC
  Administered 2018-04-24: 2 g via INTRAVENOUS

## 2018-04-24 MED ORDER — FENTANYL CITRATE (PF) 100 MCG/2ML IJ SOLN
25.0000 ug | INTRAMUSCULAR | Status: DC | PRN
  Administered 2018-04-24 (×3): 50 ug via INTRAVENOUS

## 2018-04-24 MED ORDER — CEFAZOLIN SODIUM-DEXTROSE 2-4 GM/100ML-% IV SOLN
INTRAVENOUS | Status: AC
  Filled 2018-04-24: qty 100

## 2018-04-24 MED ORDER — FAMOTIDINE 20 MG PO TABS
ORAL_TABLET | ORAL | Status: AC
  Filled 2018-04-24: qty 1

## 2018-04-24 MED ORDER — ACETAMINOPHEN 500 MG PO TABS
ORAL_TABLET | ORAL | Status: AC
  Filled 2018-04-24: qty 2

## 2018-04-24 MED ORDER — SUCCINYLCHOLINE CHLORIDE 20 MG/ML IJ SOLN
INTRAMUSCULAR | Status: DC | PRN
  Administered 2018-04-24: 100 mg via INTRAVENOUS

## 2018-04-24 MED ORDER — KETOROLAC TROMETHAMINE 30 MG/ML IJ SOLN
INTRAMUSCULAR | Status: DC | PRN
  Administered 2018-04-24: 30 mg via INTRAVENOUS

## 2018-04-24 MED ORDER — PROPOFOL 10 MG/ML IV BOLUS
INTRAVENOUS | Status: DC | PRN
  Administered 2018-04-24: 150 mg via INTRAVENOUS

## 2018-04-24 MED ORDER — PROMETHAZINE HCL 25 MG/ML IJ SOLN
INTRAMUSCULAR | Status: AC
  Administered 2018-04-24: 6.25 mg via INTRAVENOUS
  Filled 2018-04-24: qty 1

## 2018-04-24 MED ORDER — IBUPROFEN 600 MG PO TABS: 600.0000 mg | ORAL_TABLET | Freq: Three times a day (TID) | ORAL | 0 refills | Status: DC | PRN

## 2018-04-24 MED ORDER — DEXAMETHASONE SODIUM PHOSPHATE 10 MG/ML IJ SOLN
INTRAMUSCULAR | Status: DC | PRN
  Administered 2018-04-24: 10 mg via INTRAVENOUS

## 2018-04-24 MED ORDER — MIDAZOLAM HCL 2 MG/2ML IJ SOLN
INTRAMUSCULAR | Status: AC
  Filled 2018-04-24: qty 2

## 2018-04-24 MED ORDER — FENTANYL CITRATE (PF) 100 MCG/2ML IJ SOLN
50.0000 ug | Freq: Once | INTRAMUSCULAR | Status: AC | PRN
  Administered 2018-04-24: 50 ug via INTRAVENOUS

## 2018-04-24 MED ORDER — LIDOCAINE HCL (CARDIAC) PF 100 MG/5ML IV SOSY
PREFILLED_SYRINGE | INTRAVENOUS | Status: DC | PRN
  Administered 2018-04-24: 100 mg via INTRAVENOUS

## 2018-04-24 MED ORDER — OXYCODONE HCL 5 MG PO TABS: 5.0000 mg | ORAL_TABLET | ORAL | 0 refills | Status: DC | PRN

## 2018-04-24 MED ORDER — OXYCODONE HCL 5 MG PO TABS
ORAL_TABLET | ORAL | Status: AC
  Administered 2018-04-24: 5 mg via ORAL
  Filled 2018-04-24: qty 1

## 2018-04-24 MED ORDER — MIDAZOLAM HCL 2 MG/2ML IJ SOLN
INTRAMUSCULAR | Status: DC | PRN
  Administered 2018-04-24: 2 mg via INTRAVENOUS

## 2018-04-24 MED ORDER — KETOROLAC TROMETHAMINE 30 MG/ML IJ SOLN
INTRAMUSCULAR | Status: AC
  Filled 2018-04-24: qty 1

## 2018-04-24 SURGICAL SUPPLY — 46 items
"PENCIL ELECTRO HAND CTR " (MISCELLANEOUS) ×1 IMPLANT
APPLIER CLIP 5 13 M/L LIGAMAX5 (MISCELLANEOUS) ×3
BLADE SURG 15 STRL LF DISP TIS (BLADE) ×1 IMPLANT
BLADE SURG 15 STRL SS (BLADE) ×2
BNDG COHESIVE 4X5 TAN STRL (GAUZE/BANDAGES/DRESSINGS) ×2 IMPLANT
CANISTER SUCT 1200ML W/VALVE (MISCELLANEOUS) ×3 IMPLANT
CATH CHOLANGI 4FR 420404F (CATHETERS) IMPLANT
CHLORAPREP W/TINT 26ML (MISCELLANEOUS) ×3 IMPLANT
CLIP APPLIE 5 13 M/L LIGAMAX5 (MISCELLANEOUS) ×1 IMPLANT
CONRAY 60ML FOR OR (MISCELLANEOUS) ×1 IMPLANT
COVER WAND RF STERILE (DRAPES) ×3 IMPLANT
DERMABOND ADVANCED (GAUZE/BANDAGES/DRESSINGS) ×2
DERMABOND ADVANCED .7 DNX12 (GAUZE/BANDAGES/DRESSINGS) ×1 IMPLANT
DRAPE C-ARM XRAY 36X54 (DRAPES) IMPLANT
ELECT REM PT RETURN 9FT ADLT (ELECTROSURGICAL) ×3
ELECTRODE REM PT RTRN 9FT ADLT (ELECTROSURGICAL) ×1 IMPLANT
GLOVE SURG SYN 7.0 (GLOVE) ×9 IMPLANT
GLOVE SURG SYN 7.0 PF PI (GLOVE) ×1 IMPLANT
GLOVE SURG SYN 7.5  E (GLOVE) ×2
GLOVE SURG SYN 7.5 E (GLOVE) ×1 IMPLANT
GLOVE SURG SYN 7.5 PF PI (GLOVE) ×1 IMPLANT
GOWN STRL REUS W/ TWL LRG LVL3 (GOWN DISPOSABLE) ×3 IMPLANT
GOWN STRL REUS W/TWL LRG LVL3 (GOWN DISPOSABLE) ×6
IRRIGATION STRYKERFLOW (MISCELLANEOUS) IMPLANT
IRRIGATOR STRYKERFLOW (MISCELLANEOUS) ×3
IV CATH ANGIO 12GX3 LT BLUE (NEEDLE) ×1 IMPLANT
IV NS 1000ML (IV SOLUTION)
IV NS 1000ML BAXH (IV SOLUTION) IMPLANT
JACKSON PRATT 10 (INSTRUMENTS) IMPLANT
L-HOOK LAP DISP 36CM (ELECTROSURGICAL) ×3
LABEL OR SOLS (LABEL) ×3 IMPLANT
LHOOK LAP DISP 36CM (ELECTROSURGICAL) ×1 IMPLANT
NEEDLE HYPO 22GX1.5 SAFETY (NEEDLE) ×6 IMPLANT
PACK LAP CHOLECYSTECTOMY (MISCELLANEOUS) ×3 IMPLANT
PENCIL ELECTRO HAND CTR (MISCELLANEOUS) ×3 IMPLANT
POUCH SPECIMEN RETRIEVAL 10MM (ENDOMECHANICALS) ×3 IMPLANT
SCISSORS METZENBAUM CVD 33 (INSTRUMENTS) ×3 IMPLANT
SET TUBE SMOKE EVAC HIGH FLOW (TUBING) ×3 IMPLANT
SLEEVE ADV FIXATION 5X100MM (TROCAR) ×9 IMPLANT
SPONGE VERSALON 4X4 4PLY (MISCELLANEOUS) IMPLANT
SUT MNCRL 4-0 (SUTURE) ×2
SUT MNCRL 4-0 27XMFL (SUTURE) ×1
SUT VICRYL 0 AB UR-6 (SUTURE) ×3 IMPLANT
SUTURE MNCRL 4-0 27XMF (SUTURE) ×1 IMPLANT
TROCAR BALLN GELPORT 12X130M (ENDOMECHANICALS) ×3 IMPLANT
TROCAR Z-THREAD OPTICAL 5X100M (TROCAR) ×3 IMPLANT

## 2018-04-24 NOTE — Discharge Instructions (Signed)
Laparoscopic Cholecystectomy, Care After °This sheet gives you information about how to care for yourself after your procedure. Your doctor may also give you more specific instructions. If you have problems or questions, contact your doctor. °Follow these instructions at home: °Care for cuts from surgery (incisions) ° °· Follow instructions from your doctor about how to take care of your cuts from surgery. Make sure you: °? Wash your hands with soap and water before you change your bandage (dressing). If you cannot use soap and water, use hand sanitizer. °? Change your bandage as told by your doctor. °? Leave stitches (sutures), skin glue, or skin tape (adhesive) strips in place. They may need to stay in place for 2 weeks or longer. If tape strips get loose and curl up, you may trim the loose edges. Do not remove tape strips completely unless your doctor says it is okay. °· Do not take baths, swim, or use a hot tub until your doctor says it is okay. Ask your doctor if you can take showers. You may only be allowed to take sponge baths for bathing. °· Check your surgical cut area every day for signs of infection. Check for: °? More redness, swelling, or pain. °? More fluid or blood. °? Warmth. °? Pus or a bad smell. °Activity °· Do not drive or use heavy machinery while taking prescription pain medicine. °· Do not lift anything that is heavier than 10 lb (4.5 kg) until your doctor says it is okay. °· Do not play contact sports until your doctor says it is okay. °· Do not drive for 24 hours if you were given a medicine to help you relax (sedative). °· Rest as needed. Do not return to work or school until your doctor says it is okay. °General instructions °· Take over-the-counter and prescription medicines only as told by your doctor. °· To prevent or treat constipation while you are taking prescription pain medicine, your doctor may recommend that you: °? Drink enough fluid to keep your pee (urine) clear or pale  yellow. °? Take over-the-counter or prescription medicines. °? Eat foods that are high in fiber, such as fresh fruits and vegetables, whole grains, and beans. °? Limit foods that are high in fat and processed sugars, such as fried and sweet foods. °Contact a doctor if: °· You develop a rash. °· You have more redness, swelling, or pain around your surgical cuts. °· You have more fluid or blood coming from your surgical cuts. °· Your surgical cuts feel warm to the touch. °· You have pus or a bad smell coming from your surgical cuts. °· You have a fever. °· One or more of your surgical cuts breaks open. °Get help right away if: °· You have trouble breathing. °· You have chest pain. °· You have pain that is getting worse in your shoulders. °· You faint or feel dizzy when you stand. °· You have very bad pain in your belly (abdomen). °· You are sick to your stomach (nauseous) for more than one day. °· You have throwing up (vomiting) that lasts for more than one day. °· You have leg pain. °This information is not intended to replace advice given to you by your health care provider. Make sure you discuss any questions you have with your health care provider. °Document Released: 11/25/2007 Document Revised: 09/06/2015 Document Reviewed: 08/04/2015 °Elsevier Interactive Patient Education © 2019 Elsevier Inc. ° °AMBULATORY SURGERY  °DISCHARGE INSTRUCTIONS ° ° °1) The drugs that you were given   will stay in your system until tomorrow so for the next 24 hours you should not: ° °A) Drive an automobile °B) Make any legal decisions °C) Drink any alcoholic beverage ° ° °2) You may resume regular meals tomorrow.  Today it is better to start with liquids and gradually work up to solid foods. ° °You may eat anything you prefer, but it is better to start with liquids, then soup and crackers, and gradually work up to solid foods. ° ° °3) Please notify your doctor immediately if you have any unusual bleeding, trouble breathing, redness and  pain at the surgery site, drainage, fever, or pain not relieved by medication. ° ° ° °4) Additional Instructions: ° ° ° ° ° ° ° °Please contact your physician with any problems or Same Day Surgery at [phone], Monday through Friday 6 am to 4 pm, or [place] [facility] [place] Main number at [phone]. ° °

## 2018-04-24 NOTE — Anesthesia Post-op Follow-up Note (Signed)
Anesthesia QCDR form completed.        

## 2018-04-24 NOTE — Anesthesia Procedure Notes (Signed)
Procedure Name: Intubation Date/Time: 04/24/2018 11:00 AM Performed by: Nelda Marseille, CRNA Pre-anesthesia Checklist: Patient identified, Patient being monitored, Timeout performed, Emergency Drugs available and Suction available Patient Re-evaluated:Patient Re-evaluated prior to induction Oxygen Delivery Method: Circle System Utilized Preoxygenation: Pre-oxygenation with 100% oxygen Induction Type: IV induction Ventilation: Mask ventilation without difficulty Laryngoscope Size: Mac and 3 Grade View: Grade I Tube type: Oral Tube size: 7.0 mm Number of attempts: 1 Airway Equipment and Method: Stylet Placement Confirmation: ETT inserted through vocal cords under direct vision,  positive ETCO2 and breath sounds checked- equal and bilateral Secured at: 21 cm Tube secured with: Tape Dental Injury: Teeth and Oropharynx as per pre-operative assessment

## 2018-04-24 NOTE — Anesthesia Preprocedure Evaluation (Signed)
Anesthesia Evaluation  Patient identified by MRN, date of birth, ID band Patient awake    Reviewed: Allergy & Precautions, H&P , NPO status , Patient's Chart, lab work & pertinent test results  History of Anesthesia Complications Negative for: history of anesthetic complications  Airway Mallampati: II  TM Distance: >3 FB Neck ROM: full    Dental  (+) Teeth Intact, Dental Advidsory Given   Pulmonary neg pulmonary ROS,           Cardiovascular Exercise Tolerance: Good negative cardio ROS       Neuro/Psych    GI/Hepatic negative GI ROS,   Endo/Other    Renal/GU   negative genitourinary   Musculoskeletal   Abdominal   Peds  Hematology negative hematology ROS (+)   Anesthesia Other Findings Past Medical History: No date: Amenorrhea, secondary     Comment:  pregnant No date: Biliary colic 01/08/2014: Galactorrhea 09/22/2016: Gallstones 10/29/2015: Hyperprolactinemia (HCC) No date: Irregular menses; hx 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) 02/18/2016: Recent urinary tract infection     Comment:  seen in ER 03/10/2016: Vaginal bleeding in pregnancy, first trimester  Past Surgical History: No date: none  BMI    Body Mass Index:  30.86 kg/m      Reproductive/Obstetrics negative OB ROS                             Anesthesia Physical  Anesthesia Plan  ASA: I  Anesthesia Plan: General   Post-op Pain Management:    Induction: Intravenous  PONV Risk Score and Plan: 3 and Ondansetron, Dexamethasone, Midazolam, Promethazine and Treatment may vary due to age or medical condition  Airway Management Planned: Oral ETT  Additional Equipment:   Intra-op Plan:   Post-operative Plan: Extubation in OR  Informed Consent: I have reviewed the patients History and Physical, chart, labs and discussed the procedure including the risks, benefits and alternatives for the proposed anesthesia  with the patient or authorized representative who has indicated his/her understanding and acceptance.       Plan Discussed with: Anesthesiologist  Anesthesia Plan Comments:         Anesthesia Quick Evaluation

## 2018-04-24 NOTE — Anesthesia Postprocedure Evaluation (Signed)
Anesthesia Post Note  Patient: Alicia Sullivan  Procedure(s) Performed: LAPAROSCOPIC CHOLECYSTECTOMY (N/A )  Patient location during evaluation: PACU Anesthesia Type: General Level of consciousness: awake and alert Pain management: pain level controlled Vital Signs Assessment: post-procedure vital signs reviewed and stable Respiratory status: spontaneous breathing, nonlabored ventilation, respiratory function stable and patient connected to nasal cannula oxygen Cardiovascular status: blood pressure returned to baseline and stable Postop Assessment: no apparent nausea or vomiting Anesthetic complications: no     Last Vitals:  Vitals:   04/24/18 1333 04/24/18 1515  BP: 127/84 132/84  Pulse: 83 89  Resp: 18   Temp: (!) 36.3 C   SpO2: 98% 99%    Last Pain:  Vitals:   04/24/18 1515  TempSrc:   PainSc: 6                  Lenard Simmer

## 2018-04-24 NOTE — Op Note (Signed)
  Procedure Date:  04/24/2018  Pre-operative Diagnosis:  Symptomatic cholelithiasis  Post-operative Diagnosis:  Symptomatic cholelithiasis  Procedure:  Laparoscopic cholecystectomy  Surgeon:  Howie Ill, MD  Anesthesia:  General endotracheal  Estimated Blood Loss:  10 ml  Specimens:  gallbladder  Complications:  None  Indications for Procedure:  This is a 23 y.o. female who presents with abdominal pain and workup revealing symptomatic cholelithiasis.  The benefits, complications, treatment options, and expected outcomes were discussed with the patient. The risks of bleeding, infection, recurrence of symptoms, failure to resolve symptoms, bile duct damage, bile duct leak, retained common bile duct stone, bowel injury, and need for further procedures were all discussed with the patient and she was willing to proceed.  Description of Procedure: The patient was correctly identified in the preoperative area and brought into the operating room.  The patient was placed supine with VTE prophylaxis in place.  Appropriate time-outs were performed.  Anesthesia was induced and the patient was intubated.  Appropriate antibiotics were infused.  The abdomen was prepped and draped in a sterile fashion. An infraumbilical incision was made. A cutdown technique was used to enter the abdominal cavity without injury, and a Hasson trocar was inserted.  Pneumoperitoneum was obtained with appropriate opening pressures.  A 5-mm port was placed in the subxiphoid area and two 5-mm ports were placed in the right upper quadrant under direct visualization.  The gallbladder was identified.  The fundus was grasped and retracted cephalad.  Adhesions were lysed bluntly and with electrocautery. The infundibulum was grasped and retracted laterally, exposing the peritoneum overlying the gallbladder.  This was incised with electrocautery and extended on either side of the gallbladder.  The cystic duct and anterior and  posterior branches of cystic artery were clearly identified and bluntly dissected.  Both were clipped twice proximally and once distally, cutting in between.  The gallbladder was taken from the gallbladder fossa in a retrograde fashion with electrocautery. The gallbladder was placed in an Endocatch bag. The liver bed was inspected and any bleeding was controlled with electrocautery. The right upper quadrant was then inspected again revealing intact clips, no bleeding, and no ductal injury.  The area was thoroughly irrigated.  The 5 mm ports were removed under direct visualization and the Hasson trocar was removed.  The Endocatch bag was brought out via the umbilical incision. The fascial opening was closed using 0 vicryl suture.  Local anesthetic was infused in all incisions and the incisions were closed with 4-0 Monocryl.  The wounds were cleaned and sealed with DermaBond.  The patient was emerged from anesthesia and extubated and brought to the recovery room for further management.  The patient tolerated the procedure well and all counts were correct at the end of the case.   Howie Ill, MD

## 2018-04-24 NOTE — Transfer of Care (Signed)
Immediate Anesthesia Transfer of Care Note  Patient: Alicia Sullivan  Procedure(s) Performed: LAPAROSCOPIC CHOLECYSTECTOMY (N/A )  Patient Location: PACU  Anesthesia Type:General  Level of Consciousness: awake, alert  and oriented  Airway & Oxygen Therapy: Patient Spontanous Breathing and Patient connected to face mask oxygen  Post-op Assessment: Report given to RN and Post -op Vital signs reviewed and stable  Post vital signs: Reviewed and stable  Last Vitals:  Vitals Value Taken Time  BP    Temp    Pulse 106 04/24/2018 12:39 PM  Resp 27 04/24/2018 12:39 PM  SpO2 100 % 04/24/2018 12:39 PM  Vitals shown include unvalidated device data.  Last Pain:  Vitals:   04/24/18 0926  TempSrc: Temporal  PainSc: 0-No pain         Complications: No apparent anesthesia complications

## 2018-04-24 NOTE — Interval H&P Note (Signed)
History and Physical Interval Note:  04/24/2018 10:26 AM  Alicia Sullivan  has presented today for surgery, with the diagnosis of GALLSTONES, CHOLELITHIASIS  The various methods of treatment have been discussed with the patient and family. After consideration of risks, benefits and other options for treatment, the patient has consented to  Procedure(s): LAPAROSCOPIC CHOLECYSTECTOMY (N/A) as a surgical intervention .  The patient's history has been reviewed, patient examined, no change in status, stable for surgery.  I have reviewed the patient's chart and labs.  Questions were answered to the patient's satisfaction.     Shawndell Schillaci

## 2018-04-25 LAB — SURGICAL PATHOLOGY

## 2018-05-10 ENCOUNTER — Encounter: Payer: Self-pay | Admitting: Surgery

## 2018-05-10 ENCOUNTER — Ambulatory Visit (INDEPENDENT_AMBULATORY_CARE_PROVIDER_SITE_OTHER): Admitting: Surgery

## 2018-05-10 ENCOUNTER — Other Ambulatory Visit: Payer: Self-pay

## 2018-05-10 VITALS — BP 118/79 | HR 89 | Temp 97.7°F | Resp 11 | Ht 59.0 in | Wt 138.0 lb

## 2018-05-10 DIAGNOSIS — K8020 Calculus of gallbladder without cholecystitis without obstruction: Secondary | ICD-10-CM

## 2018-05-10 DIAGNOSIS — Z09 Encounter for follow-up examination after completed treatment for conditions other than malignant neoplasm: Secondary | ICD-10-CM

## 2018-05-10 NOTE — Patient Instructions (Addendum)
The patient is aware to call back for any questions or new concerns. Resume activities as tolerated. No heavy lifting for 2 weeks

## 2018-05-10 NOTE — Progress Notes (Signed)
05/10/2018  HPI: Alicia Sullivan is a 23 y.o. female s/p laparoscopic cholecystectomy on 04/24/18.  She presents today for follow up.  Reports doing well with the incisions except a bit of soreness at the umbilical site with particular movements.  The dermabond remains in place.  Has been having bowel movements rapidly after each meal.  Vital signs: BP 118/79   Pulse 89   Temp 97.7 F (36.5 C) (Temporal)   Resp 11   Ht 4\' 11"  (1.499 m)   Wt 138 lb (62.6 kg)   LMP 04/23/2018 (Exact Date)   SpO2 98%   BMI 27.87 kg/m    Physical Exam: Constitutional: No acute distress Abdomen:  Soft, non-distended, non-tender to palpation.  Incisions are clean, dry, intact.  Dermabond removed out of two incisions (subxiphoid and mid-clavicular) with very mild hairline opening of the incision noted.  No further dermabond was removed as a precaution.  No evidence of infection in any of the wounds.  Assessment/Plan: This is a 23 y.o. female s/p laparoscopic cholecystectomy.  --Patient healing well.  Discussed with her that there GI symptom is not uncommon after having gallbladder surgery and that it should improve as her body adjusts to not having a gallbladder. --Discussed with her that she should leave the dermabond on for another week and then she can peel it off at home.  The two incisions with hairline opening will seal up in the next couple of days. --Follow up prn.   Howie Ill, MD [facility]

## 2018-09-19 ENCOUNTER — Other Ambulatory Visit: Payer: Self-pay

## 2018-09-19 DIAGNOSIS — Z20822 Contact with and (suspected) exposure to covid-19: Secondary | ICD-10-CM

## 2018-09-21 LAB — NOVEL CORONAVIRUS, NAA: SARS-CoV-2, NAA: NOT DETECTED

## 2018-09-27 ENCOUNTER — Telehealth: Payer: Self-pay | Admitting: General Practice

## 2018-09-27 NOTE — Telephone Encounter (Signed)
Patient was informed of negative covid-19 result. Patient verbalized understanding.  °

## 2018-12-20 ENCOUNTER — Other Ambulatory Visit: Payer: Self-pay | Admitting: *Deleted

## 2018-12-20 DIAGNOSIS — Z20822 Contact with and (suspected) exposure to covid-19: Secondary | ICD-10-CM

## 2018-12-21 LAB — NOVEL CORONAVIRUS, NAA: SARS-CoV-2, NAA: NOT DETECTED

## 2018-12-25 ENCOUNTER — Telehealth: Payer: Self-pay | Admitting: General Practice

## 2018-12-25 NOTE — Telephone Encounter (Signed)
Negative COVID results given. Patient results "NOT Detected." Caller expressed understanding. ° °

## 2019-06-06 ENCOUNTER — Encounter: Payer: Self-pay | Admitting: Obstetrics and Gynecology

## 2019-09-17 ENCOUNTER — Ambulatory Visit: Payer: Self-pay | Admitting: Certified Nurse Midwife

## 2019-09-28 ENCOUNTER — Ambulatory Visit: Payer: Self-pay | Admitting: Certified Nurse Midwife

## 2019-10-11 ENCOUNTER — Ambulatory Visit (INDEPENDENT_AMBULATORY_CARE_PROVIDER_SITE_OTHER): Payer: Self-pay | Admitting: Certified Nurse Midwife

## 2019-10-11 ENCOUNTER — Encounter: Payer: Self-pay | Admitting: Certified Nurse Midwife

## 2019-10-11 VITALS — BP 120/79 | HR 76 | Ht 59.0 in | Wt 150.9 lb

## 2019-10-11 DIAGNOSIS — T8384XA Pain from genitourinary prosthetic devices, implants and grafts, initial encounter: Secondary | ICD-10-CM

## 2019-10-11 NOTE — Progress Notes (Signed)
GYN ENCOUNTER NOTE  Subjective:       Alicia Sullivan is a 24 y.o. G62P1001 female is here for gynecologic evaluation of the following issues:  1. Central pelvic pain with IUD for the last few weeks-"feels uncomfortable".   Last seen in office on 09/22/2017; for further details, please see note.   Reports removal of ParaGard IUD and replacement with Mirena in 01/2019 by provider in [place].   Denies difficulty breathing or respiratory distress, chest pain, abdominal pain, excessive vaginal bleeding, dysuria, and leg pain or swelling.    Gynecologic History  No LMP recorded. (Menstrual status: IUD).  Contraception: IUD, Mirena; placed 01/2019 in [place]  Last Pap: due.   Obstetric History  OB History  Gravida Para Term Preterm AB Living  1 1 1     1   SAB TAB Ectopic Multiple Live Births        0 1    # Outcome Date GA Lbr Len/2nd Weight Sex Delivery Anes PTL Lv  1 Term 10/23/16 [redacted]w[redacted]d / 00:13 5 lb 4 oz (2.38 kg) F Vag-Spont EPI  LIV     Birth Comments: no anomalies noted at delivery    Past Medical History:  Diagnosis Date  . Allergic contact dermatitis 11/10/2016  . Amenorrhea, secondary    pregnant  . Anemia    WITH PREGNANCY ONLY  . Biliary colic   . Galactorrhea 01/08/2014  . Gallstones 09/22/2016  . 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 Irregular menses; hx   . IUD (intrauterine device) in place 03/13/2017  . 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 Recent urinary tract infection 02/18/2016   seen in ER  . Vaginal bleeding in pregnancy, first trimester 03/10/2016    Past Surgical History:  Procedure Laterality Date  . CHOLECYSTECTOMY N/A 04/24/2018   Procedure: LAPAROSCOPIC CHOLECYSTECTOMY;  Surgeon: 04/26/2018, MD;  Location: [facility] ORS;  Service: General;  Laterality: N/A;    Current Outpatient Medications on File Prior to Visit  Medication Sig Dispense Refill  . BIOTIN PO Take 1 tablet by mouth daily. (Patient not taking: Reported on 10/11/2019)    . ibuprofen  (ADVIL,MOTRIN) 600 MG tablet Take 1 tablet (600 mg total) by mouth every 8 (eight) hours as needed for fever, mild pain or moderate pain. (Patient not taking: Reported on 10/11/2019) 30 tablet 0   No current facility-administered medications on file prior to visit.    No Known Allergies  Social History   Socioeconomic History  . Marital status: Married    Spouse name: Not on file  . Number of children: Not on file  . Years of education: Not on file  . Highest education level: Not on file  Occupational History    Comment: nonemployed  Tobacco Use  . Smoking status: Never Smoker  . Smokeless tobacco: Never Used  Vaping Use  . Vaping Use: Former  . Substances: Nicotine, Flavoring  Substance and Sexual Activity  . Alcohol use: No  . Drug use: No  . Sexual activity: Yes    Partners: Male    Birth control/protection: I.U.D.  Other Topics Concern  . Not on file  Social History Narrative  . Not on file   Social Determinants of Health   Financial Resource Strain:   . Difficulty of Paying Living Expenses:   Food Insecurity:   . Worried About 12/11/2019 in the Last Year:   . Programme researcher, broadcasting/film/video in the Last Year:   Transportation Needs:   . Lack  of Transportation (Medical):   Marland Kitchen Lack of Transportation (Non-Medical):   Physical Activity:   . Days of Exercise per Week:   . Minutes of Exercise per Session:   Stress:   . Feeling of Stress :   Social Connections:   . Frequency of Communication with Friends and Family:   . Frequency of Social Gatherings with Friends and Family:   . Attends Religious Services:   . Active Member of Clubs or Organizations:   . Attends Banker Meetings:   Marland Kitchen Marital Status:   Intimate Partner Violence:   . Fear of Current or Ex-Partner:   . Emotionally Abused:   Marland Kitchen Physically Abused:   . Sexually Abused:     Family History  Problem Relation Age of Onset  . Diabetes Father   . Hyperlipidemia Father     The following  portions of the patient's history were reviewed and updated as appropriate: allergies, current medications, past family history, past medical history, past social history, past surgical history and problem list.  Review of Systems  ROS negative except as noted above. Information obtained from patient.   Objective:   BP 120/79   Pulse 76   Ht 4\' 11"  (1.499 m)   Wt 150 lb 14.4 oz (68.4 kg)   BMI 30.48 kg/m    CONSTITUTIONAL: Well-developed, well-nourished female in no acute distress.   ABDOMEN: Soft, non distended; Non tender.  No Organomegaly.  PELVIC:  External Genitalia: Normal  Vagina: Normal  Cervix: Normal, IUD strings present  Uterus: Normal size, shape,consistency, mobile  Adnexa: Normal   MUSCULOSKELETAL: Normal range of motion. No tenderness.  No cyanosis, clubbing, or edema.  Assessment:   1. Pain due to intrauterine contraceptive device (IUD), initial encounter (HCC)   - PELVIS TRANSVAGINAL NON-OB (TV ONLY); Future - US PELVIS (TRANSABDOMINAL ONLY); Future     Plan:   No change in vaginal discharge, declines vaginal swab.   Reviewed red flag symptoms and when to call.   RTC for ultrasound to verify IUD placement.   RTC for ANNUAL EXAM and Pap or sooner if needed.    [place], CNM [facility], [facility] 10/11/19 5:25 PM

## 2019-10-11 NOTE — Patient Instructions (Signed)
Preventive Care 7-22 Years Old, Female Preventive care refers to visits with your health care provider and lifestyle choices that can promote health and wellness. This includes:  A yearly physical exam. This may also be called an annual well check.  Regular dental visits and eye exams.  Immunizations.  Screening for certain conditions.  Healthy lifestyle choices, such as eating a healthy diet, getting regular exercise, not using drugs or products that contain nicotine and tobacco, and limiting alcohol use. What can I expect for my preventive care visit? Physical exam Your health care provider will check your:  Height and weight. This may be used to calculate body mass index (BMI), which tells if you are at a healthy weight.  Heart rate and blood pressure.  Skin for abnormal spots. Counseling Your health care provider may ask you questions about your:  Alcohol, tobacco, and drug use.  Emotional well-being.  Home and relationship well-being.  Sexual activity.  Eating habits.  Work and work Statistician.  Method of birth control.  Menstrual cycle.  Pregnancy history. What immunizations do I need?  Influenza (flu) vaccine  This is recommended every year. Tetanus, diphtheria, and pertussis (Tdap) vaccine  You may need a Td booster every 10 years. Varicella (chickenpox) vaccine  You may need this if you have not been vaccinated. Human papillomavirus (HPV) vaccine  If recommended by your health care provider, you may need three doses over 6 months. Measles, mumps, and rubella (MMR) vaccine  You may need at least one dose of MMR. You may also need a second dose. Meningococcal conjugate (MenACWY) vaccine  One dose is recommended if you are age 32-21 years and a first-year college student living in a residence hall, or if you have one of several medical conditions. You may also need additional booster doses. Pneumococcal conjugate (PCV13) vaccine  You may need  this if you have certain conditions and were not previously vaccinated. Pneumococcal polysaccharide (PPSV23) vaccine  You may need one or two doses if you smoke cigarettes or if you have certain conditions. Hepatitis A vaccine  You may need this if you have certain conditions or if you travel or work in places where you may be exposed to hepatitis A. Hepatitis B vaccine  You may need this if you have certain conditions or if you travel or work in places where you may be exposed to hepatitis B. Haemophilus influenzae type b (Hib) vaccine  You may need this if you have certain conditions. You may receive vaccines as individual doses or as more than one vaccine together in one shot (combination vaccines). Talk with your health care provider about the risks and benefits of combination vaccines. What tests do I need?  Blood tests  Lipid and cholesterol levels. These may be checked every 5 years starting at age 20.  Hepatitis C test.  Hepatitis B test. Screening  Diabetes screening. This is done by checking your blood sugar (glucose) after you have not eaten for a while (fasting).  Sexually transmitted disease (STD) testing.  BRCA-related cancer screening. This may be done if you have a family history of breast, ovarian, tubal, or peritoneal cancers.  Pelvic exam and Pap test. This may be done every 3 years starting at age 63. Starting at age 52, this may be done every 5 years if you have a Pap test in combination with an HPV test. Talk with your health care provider about your test results, treatment options, and if necessary, the need for more tests.  Follow these instructions at home: Eating and drinking   Eat a diet that includes fresh fruits and vegetables, whole grains, lean protein, and low-fat dairy.  Take vitamin and mineral supplements as recommended by your health care provider.  Do not drink alcohol if: ? Your health care provider tells you not to drink. ? You are  pregnant, may be pregnant, or are planning to become pregnant.  If you drink alcohol: ? Limit how much you have to 0-1 drink a day. ? Be aware of how much alcohol is in your drink. In the [place] one drink equals one 12 oz bottle of beer (355 mL), one 5 oz glass of wine (148 mL), or one 1 oz glass of hard liquor (44 mL). Lifestyle  Take daily care of your teeth and gums.  Stay active. Exercise for at least 30 minutes on 5 or more days each week.  Do not use any products that contain nicotine or tobacco, such as cigarettes, e-cigarettes, and chewing tobacco. If you need help quitting, ask your health care provider.  If you are sexually active, practice safe sex. Use a condom or other form of birth control (contraception) in order to prevent pregnancy and STIs (sexually transmitted infections). If you plan to become pregnant, see your health care provider for a preconception visit. What's next?  Visit your health care provider once a year for a well check visit.  Ask your health care provider how often you should have your eyes and teeth checked.  Stay up to date on all vaccines. This information is not intended to replace advice given to you by your health care provider. Make sure you discuss any questions you have with your health care provider. Document Revised: 10/27/2017 Document Reviewed: 10/27/2017 Elsevier Patient Education  2020 [employer].

## 2019-10-24 ENCOUNTER — Other Ambulatory Visit: Payer: Self-pay

## 2019-10-24 ENCOUNTER — Ambulatory Visit (INDEPENDENT_AMBULATORY_CARE_PROVIDER_SITE_OTHER): Payer: Self-pay

## 2019-10-24 DIAGNOSIS — T8384XA Pain from genitourinary prosthetic devices, implants and grafts, initial encounter: Secondary | ICD-10-CM

## 2019-12-28 ENCOUNTER — Encounter: Payer: Medicaid Other | Admitting: Certified Nurse Midwife

## 2020-01-04 ENCOUNTER — Other Ambulatory Visit (HOSPITAL_COMMUNITY)
Admission: RE | Admit: 2020-01-04 | Discharge: 2020-01-04 | Disposition: A | Discharge status: Home / Self Care | Source: Ambulatory Visit | Attending: Certified Nurse Midwife | Admitting: Certified Nurse Midwife

## 2020-01-04 ENCOUNTER — Telehealth: Payer: Self-pay

## 2020-01-04 ENCOUNTER — Ambulatory Visit (INDEPENDENT_AMBULATORY_CARE_PROVIDER_SITE_OTHER): Payer: Self-pay | Admitting: Certified Nurse Midwife

## 2020-01-04 ENCOUNTER — Other Ambulatory Visit: Payer: Self-pay

## 2020-01-04 ENCOUNTER — Encounter: Payer: Self-pay | Admitting: Certified Nurse Midwife

## 2020-01-04 VITALS — BP 122/68 | HR 93 | Wt 163.8 lb

## 2020-01-04 DIAGNOSIS — N898 Other specified noninflammatory disorders of vagina: Secondary | ICD-10-CM | POA: Insufficient documentation

## 2020-01-04 MED ORDER — FLUCONAZOLE 150 MG PO TABS: 150.0000 mg | ORAL_TABLET | Freq: Once | ORAL | 0 refills | Status: AC

## 2020-01-04 NOTE — Progress Notes (Signed)
GYN ENCOUNTER NOTE  Subjective:       Alicia Sullivan is a 24 y.o. G28P1001 female is here for gynecologic evaluation of the following issues:  1. Vaginal irritation and itching for the last three (3) days, no discharge  Denies difficulty breathing or respiratory distress, chest pain, abdominal pain, vaginal bleeding, dysuria, and leg pain or swelling.     Gynecologic History  No LMP recorded. (Menstrual status: IUD).  Contraception: IUD  Last Pap: due  Obstetric History  OB History  Gravida Para Term Preterm AB Living  1 1 1     1   SAB TAB Ectopic Multiple Live Births        0 1    # Outcome Date GA Lbr Len/2nd Weight Sex Delivery Anes PTL Lv  1 Term 10/23/16 [redacted]w[redacted]d / 00:13 5 lb 4 oz (2.38 kg) F Vag-Spont EPI  LIV     Birth Comments: no anomalies noted at delivery    Past Medical History:  Diagnosis Date   Allergic contact dermatitis 11/10/2016   Amenorrhea, secondary    pregnant   Anemia    WITH PREGNANCY ONLY   Biliary colic    Galactorrhea 01/08/2014   Gallstones 09/22/2016   Hyperprolactinemia (HCC) 10/29/2015   Irregular menses; hx    IUD (intrauterine device) in place 03/13/2017   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     BIOTIN PO Take 1 tablet by mouth daily. (Patient not taking: Reported on 10/11/2019)     ibuprofen (ADVIL,MOTRIN) 600 MG tablet Take 1 tablet (600 mg total) by mouth every 8 (eight) hours as needed for fever, mild pain or moderate pain.  (Patient not taking: Reported on 10/11/2019) 30 tablet 0   No current facility-administered medications on file prior to visit.    No Known Allergies  Social History   Socioeconomic History   Marital status: Married    Spouse name: Not on file   Number of children: Not on file   Years of education: Not on file   Highest education level: Not on file  Occupational History    Comment: nonemployed  Tobacco Use   Smoking status: Never Smoker   Smokeless tobacco: Never Used  12/11/2019 Use: Former   Substances: Nicotine, Flavoring  Substance and Sexual Activity   Alcohol use: No   Drug use: No   Sexual activity: Yes    Partners: Male    Birth control/protection: I.U.D.  Other Topics Concern   Not on file  Social History Narrative   Not on file   Social Determinants of Health   Financial Resource Strain:    Difficulty of Paying Living Expenses: Not on file  Food Insecurity:    Worried About Running Out of Food in the Last Year: Not on file   Ran Out of Food in the Last Year: Not on file  Transportation Needs:    Lack of Transportation (Medical): Not on file   Lack of Transportation (Non-Medical): Not on file  Physical Activity:    Days of Exercise per Week: Not on file   Minutes of Exercise per Session: Not on file  Stress:    Feeling of Stress : Not on file  Social Connections:    Frequency of Communication with Friends and Family: Not on file   Frequency of Social Gatherings with Friends and Family: Not on file   Attends Religious Services: Not on file   Active Member of Clubs or Organizations: Not on file   Attends Banker Meetings: Not on file   Marital Status: Not on file  Intimate Partner Violence:    Fear of Current or Ex-Partner: Not on file   Emotionally Abused: Not on file   Physically Abused: Not on file   Sexually Abused: Not on file    Family History  Problem Relation Age of Onset   Diabetes  Father    Hyperlipidemia Father     The following portions of the patient's history were reviewed and updated as appropriate: allergies, current medications, past family history, past medical history, past social history, past surgical history and problem list.  Review of Systems  ROS negative except as noted above. Information obtained from patient.   Objective:   Pulse 93    Wt 163 lb 12.8 oz (74.3 kg)    BMI 33.08 kg/m    CONSTITUTIONAL: Well-developed, well-nourished female in no acute distress.   PELVIC:  External Genitalia: Normal  Vagina: Normal except for thick, white, curd like discharge present-swab collected  Cervix: Normal   MUSCULOSKELETAL: Normal range of motion. No tenderness.  No cyanosis, clubbing, or edema.  Assessment:   1. Vaginal itching  - Cervicovaginal ancillary only  2. Vaginal irritation  - Cervicovaginal ancillary only     Plan:   Vaginal swab collected. Will contact patient with results.   Rx Diflucan, see orders.   Reviewed red flag symptoms and when to call.   Encouraged patient to rescheduled ANNUAL EXAM and PAP.    Serafina Royals, CNM [facility], [facility] 01/04/20 4:52 PM

## 2020-01-04 NOTE — Telephone Encounter (Signed)
Spoke to pt in regards of message. Informed her she will need to schedule an appointment to be seen. Pt requested same day appointment. Pt added 01/04/20 3:30 pm- ok with JML. Pt verbalized understanding.

## 2020-01-04 NOTE — Telephone Encounter (Signed)
Patient was last seen in office in 09/2019. Will need appointment. Thanks, JML

## 2020-01-04 NOTE — Progress Notes (Signed)
Pt present for possible yeast infection. C/o vaginal irritation and itching x 3 days.

## 2020-01-04 NOTE — Patient Instructions (Addendum)
Vaginitis Vaginitis is a condition in which the vaginal tissue swells and becomes red (inflamed). This condition is most often caused by a change in the normal balance of bacteria and yeast that live in the vagina. This change causes an overgrowth of certain bacteria or yeast, which causes the inflammation. There are different types of vaginitis, but the most common types are:  Bacterial vaginosis.  Yeast infection (candidiasis).  Trichomoniasis vaginitis. This is a sexually transmitted disease (STD).  Viral vaginitis.  Atrophic vaginitis.  Allergic vaginitis. What are the causes? The cause of this condition depends on the type of vaginitis. It can be caused by:  Bacteria (bacterial vaginosis).  Yeast, which is a fungus (yeast infection).  A parasite (trichomoniasis vaginitis).  A virus (viral vaginitis).  Low hormone levels (atrophic vaginitis). Low hormone levels can occur during pregnancy, breastfeeding, or after menopause.  Irritants, such as bubble baths, scented tampons, and feminine sprays (allergic vaginitis). Other factors can change the normal balance of the yeast and bacteria that live in the vagina. These include:  Antibiotic medicines.  Poor hygiene.  Diaphragms, vaginal sponges, spermicides, birth control pills, and intrauterine devices (IUD).  Sex.  Infection.  Uncontrolled diabetes.  A weakened defense (immune) system. What increases the risk? This condition is more likely to develop in women who:  Smoke.  Use vaginal douches, scented tampons, or scented sanitary pads.  Wear tight-fitting pants.  Wear thong underwear.  Use oral birth control pills or an IUD.  Have sex without a condom.  Have multiple sex partners.  Have an STD.  Frequently use the spermicide nonoxynol-9.  Eat lots of foods high in sugar.  Have uncontrolled diabetes.  Have low estrogen levels.  Have a weakened immune system from an immune disorder or medical  treatment.  Are pregnant or breastfeeding. What are the signs or symptoms? Symptoms vary depending on the cause of the vaginitis. Common symptoms include:  Abnormal vaginal discharge. ? The discharge is white, gray, or yellow with bacterial vaginosis. ? The discharge is thick, white, and cheesy with a yeast infection. ? The discharge is frothy and yellow or greenish with trichomoniasis.  A bad vaginal smell. The smell is fishy with bacterial vaginosis.  Vaginal itching, pain, or swelling.  Sex that is painful.  Pain or burning when urinating. Sometimes there are no symptoms. How is this diagnosed? This condition is diagnosed based on your symptoms and medical history. A physical exam, including a pelvic exam, will also be done. You may also have other tests, including:  Tests to determine the pH level (acidity or alkalinity) of your vagina.  A whiff test, to assess the odor that results when a sample of your vaginal discharge is mixed with a potassium hydroxide solution.  Tests of vaginal fluid. A sample will be examined under a microscope. How is this treated? Treatment varies depending on the type of vaginitis you have. Your treatment may include:  Antibiotic creams or pills to treat bacterial vaginosis and trichomoniasis.  Antifungal medicines, such as vaginal creams or suppositories, to treat a yeast infection.  Medicine to ease discomfort if you have viral vaginitis. Your sexual partner should also be treated.  Estrogen delivered in a cream, pill, suppository, or vaginal ring to treat atrophic vaginitis. If vaginal dryness occurs, lubricants and moisturizing creams may help. You may need to avoid scented soaps, sprays, or douches.  Stopping use of a product that is causing allergic vaginitis. Then using a vaginal cream to treat the symptoms. Follow   these instructions at home: Lifestyle  Keep your genital area clean and dry. Avoid soap, and only rinse the area with  water.  Do not douche or use tampons until your health care provider says it is okay to do so. Use sanitary pads, if needed.  Do not have sex until your health care provider approves. When you can return to sex, practice safe sex and use condoms.  Wipe from front to back. This avoids the spread of bacteria from the rectum to the vagina. General instructions  Take over-the-counter and prescription medicines only as told by your health care provider.  If you were prescribed an antibiotic medicine, take or use it as told by your health care provider. Do not stop taking or using the antibiotic even if you start to feel better.  Keep all follow-up visits as told by your health care provider. This is important. How is this prevented?  Use mild, non-scented products. Do not use things that can irritate the vagina, such as fabric softeners. Avoid the following products if they are scented: ? Feminine sprays. ? Detergents. ? Tampons. ? Feminine hygiene products. ? Soaps or bubble baths.  Let air reach your genital area. ? Wear cotton underwear to reduce moisture buildup. ? Avoid wearing underwear while you sleep. ? Avoid wearing tight pants and underwear or nylons without a cotton panel. ? Avoid wearing thong underwear.  Take off any wet clothing, such as bathing suits, as soon as possible.  Practice safe sex and use condoms. Contact a health care provider if:  You have abdominal pain.  You have a fever.  You have symptoms that last for more than 2-3 days. Get help right away if:  You have a fever and your symptoms suddenly get worse. Summary  Vaginitis is a condition in which the vaginal tissue becomes inflamed.This condition is most often caused by a change in the normal balance of bacteria and yeast that live in the vagina.  Treatment varies depending on the type of vaginitis you have.  Do not douche, use tampons , or have sex until your health care provider approves. When  you can return to sex, practice safe sex and use condoms. This information is not intended to replace advice given to you by your health care provider. Make sure you discuss any questions you have with your health care provider. Document Revised: 01/28/2017 Document Reviewed: 03/23/2016 Elsevier Patient Education  2020 Elsevier Inc.  Fluconazole tablets What is this medicine? FLUCONAZOLE (floo KON na zole) is an antifungal medicine. It is used to treat certain kinds of fungal or yeast infections. This medicine may be used for other purposes; ask your health care provider or pharmacist if you have questions. COMMON BRAND NAME(S): Diflucan What should I tell my health care provider before I take this medicine? They need to know if you have any of these conditions:  history of irregular heart beat  kidney disease  an unusual or allergic reaction to fluconazole, other azole antifungals, medicines, foods, dyes, or preservatives  pregnant or trying to get pregnant  breast-feeding How should I use this medicine? Take this medicine by mouth. Follow the directions on the prescription label. Do not take your medicine more often than directed. Talk to your pediatrician regarding the use of this medicine in children. Special care may be needed. This medicine has been used in children as young as 45 months of age. Overdosage: If you think you have taken too much of this medicine contact a  poison control center or emergency room at once. NOTE: This medicine is only for you. Do not share this medicine with others. What if I miss a dose? If you miss a dose, take it as soon as you can. If it is almost time for your next dose, take only that dose. Do not take double or extra doses. What may interact with this medicine? Do not take this medicine with any of the following medications:  astemizole  certain medicines for irregular heart beat like dronedarone,  quinidine  cisapride  erythromycin  lomitapide  other medicines that prolong the QT interval (cause an abnormal heart rhythm)  pimozide  terfenadine  thioridazine This medicine may also interact with the following medications:  antiviral medicines for HIV or AIDS  birth control pills  certain antibiotics like rifabutin, rifampin  certain medicines for blood pressure like amlodipine, isradipine, felodipine, hydrochlorothiazide, losartan, nifedipine  certain medicines for cancer like cyclophosphamide, ibrutinib, vinblastine, vincristine  certain medicines for cholesterol like atorvastatin, lovastatin, fluvastatin, simvastatin  certain medicines for depression, anxiety, or psychotic disturbances like amitriptyline, midazolam, nortriptyline, triazolam  certain medicines for diabetes like glipizide, glyburide, tolbutamide  certain medicines for pain like alfentanil, fentanyl, methadone  certain medicines for seizures like carbamazepine, phenytoin  certain medicines that treat or prevent blood clots like warfarin  dofetilide  halofantrine  medicines that lower your chance of fighting infection like cyclosporine, prednisone, tacrolimus  NSAIDS, medicines for pain and inflammation, like celecoxib, diclofenac, flurbiprofen, ibuprofen, meloxicam, naproxen  other medicines for fungal infections  sirolimus  theophylline  tofacitinib  tolvaptan  ziprasidone This list may not describe all possible interactions. Give your health care provider a list of all the medicines, herbs, non-prescription drugs, or dietary supplements you use. Also tell them if you smoke, drink alcohol, or use illegal drugs. Some items may interact with your medicine. What should I watch for while using this medicine? Visit your doctor or health care professional for regular checkups. If you are taking this medicine for a long time you may need blood work. Tell your doctor if your symptoms do not  improve. Some fungal infections need many weeks or months of treatment to cure. Alcohol can increase possible damage to your liver. Avoid alcoholic drinks. If you have a vaginal infection, do not have sex until you have finished your treatment. You can wear a sanitary napkin. Do not use tampons. Wear freshly washed cotton, not synthetic, panties. What side effects may I notice from receiving this medicine? Side effects that you should report to your doctor or health care professional as soon as possible:  allergic reactions like skin rash or itching, hives, swelling of the lips, mouth, tongue, or throat  dark urine  feeling dizzy or faint  irregular heartbeat or chest pain  redness, blistering, peeling or loosening of the skin, including inside the mouth  trouble breathing  unusual bruising or bleeding  vomiting  yellowing of the eyes or skin Side effects that usually do not require medical attention (report to your doctor or health care professional if they continue or are bothersome):  changes in how food tastes  diarrhea  headache  stomach upset or nausea This list may not describe all possible side effects. Call your doctor for medical advice about side effects. You may report side effects to FDA at 1-800-FDA-1088. Where should I keep my medicine? Keep out of the reach of children. Store at room temperature below 30 degrees C (86 degrees F). Throw away any medicine after  the expiration date. NOTE: This sheet is a summary. It may not cover all possible information. If you have questions about this medicine, talk to your doctor, pharmacist, or health care provider.  2020 Elsevier/Gold Standard (2018-11-09 11:41:56)

## 2020-01-04 NOTE — Telephone Encounter (Signed)
Patient called in stating that she thinks she has a yeast infection and would like a prescription sent in to her pharmacy. Could you please advise?

## 2020-01-08 LAB — CERVICOVAGINAL ANCILLARY ONLY
Bacterial Vaginitis (gardnerella): POSITIVE — AB
Candida Glabrata: NEGATIVE
Candida Vaginitis: POSITIVE — AB
Chlamydia: NEGATIVE
Comment: NEGATIVE
Comment: NEGATIVE
Comment: NEGATIVE
Comment: NEGATIVE
Comment: NEGATIVE
Comment: NORMAL
Neisseria Gonorrhea: NEGATIVE
Trichomonas: NEGATIVE

## 2020-01-31 ENCOUNTER — Other Ambulatory Visit: Payer: Self-pay

## 2020-01-31 ENCOUNTER — Telehealth: Payer: Self-pay

## 2020-01-31 MED ORDER — METRONIDAZOLE 500 MG PO TABS: 500.0000 mg | ORAL_TABLET | Freq: Two times a day (BID) | ORAL | 0 refills | Status: DC

## 2020-01-31 NOTE — Telephone Encounter (Signed)
Pt called in and stated that she needs a refill on her meds for the BV. The pt said that she isnt having discharge but she still has an odor.  Can you refill the meds or does she need to come in for another appointment? Please advise pt uses [facility] on [address]

## 2020-01-31 NOTE — Telephone Encounter (Signed)
Flagyl sent to pharmacy on file (as requested) mychart message sent

## 2020-02-28 ENCOUNTER — Encounter: Payer: Medicaid Other | Admitting: Certified Nurse Midwife

## 2020-03-20 NOTE — Patient Instructions (Incomplete)

## 2020-03-21 ENCOUNTER — Encounter: Payer: Medicaid Other | Admitting: Certified Nurse Midwife

## 2020-03-21 DIAGNOSIS — Z124 Encounter for screening for malignant neoplasm of cervix: Secondary | ICD-10-CM

## 2020-03-21 DIAGNOSIS — Z01419 Encounter for gynecological examination (general) (routine) without abnormal findings: Secondary | ICD-10-CM

## 2020-04-01 NOTE — Telephone Encounter (Signed)
Error

## 2020-04-22 ENCOUNTER — Ambulatory Visit: Admitting: Podiatry

## 2020-05-19 ENCOUNTER — Other Ambulatory Visit: Payer: Self-pay

## 2020-05-22 ENCOUNTER — Encounter: Payer: Medicaid Other | Admitting: Certified Nurse Midwife

## 2020-06-02 ENCOUNTER — Other Ambulatory Visit (HOSPITAL_COMMUNITY)
Admission: RE | Admit: 2020-06-02 | Discharge: 2020-06-02 | Disposition: A | Discharge status: Home / Self Care | Payer: Self-pay | Source: Ambulatory Visit | Attending: Certified Nurse Midwife | Admitting: Certified Nurse Midwife

## 2020-06-02 ENCOUNTER — Other Ambulatory Visit: Payer: Self-pay

## 2020-06-02 ENCOUNTER — Ambulatory Visit (INDEPENDENT_AMBULATORY_CARE_PROVIDER_SITE_OTHER): Payer: Medicaid Other | Admitting: Certified Nurse Midwife

## 2020-06-02 ENCOUNTER — Encounter: Payer: Self-pay | Admitting: Certified Nurse Midwife

## 2020-06-02 VITALS — BP 139/94 | HR 81 | Resp 16 | Ht 59.0 in | Wt 161.1 lb

## 2020-06-02 DIAGNOSIS — Z30432 Encounter for removal of intrauterine contraceptive device: Secondary | ICD-10-CM | POA: Diagnosis not present

## 2020-06-02 DIAGNOSIS — Z8742 Personal history of other diseases of the female genital tract: Secondary | ICD-10-CM

## 2020-06-02 NOTE — Progress Notes (Signed)
Alicia Sullivan is a 25 y.o. year old [id number] Hispanic female who presents for removal of a Mirena IUD due to frequent vaginitis, requests testing today. Pans condoms and plan B as contraception. Her Mirena IUD was placed 01/2019.   BP (!) 139/94   Pulse 81   Resp 16   Ht 4\' 11"  (1.499 m)   Wt 161 lb 1.6 oz (73.1 kg)   BMI 32.54 kg/m   Time out was performed.  A small plastic speculum was placed in the vagina.  The cervix was visualized, and the strings were visible. They were grasped and the Mirena was easily removed intact without complications.   Vaginal swab today, see orders.   Reviewed red flag symptoms and when to call.   RTC as previously scheduled or sooner if needed.    , CNM [facility], [facility] 06/02/20 11:28 AM

## 2020-06-02 NOTE — Patient Instructions (Signed)
Preventive Care 11-23 Years Old, Female Preventive care refers to lifestyle choices and visits with your health care provider that can promote health and wellness. This includes:  A yearly physical exam. This is also called an annual wellness visit.  Regular dental and eye exams.  Immunizations.  Screening for certain conditions.  Healthy lifestyle choices, such as: ? Eating a healthy diet. ? Getting regular exercise. ? Not using drugs or products that contain nicotine and tobacco. ? Limiting alcohol use. What can I expect for my preventive care visit? Physical exam Your health care provider may check your:  Height and weight. These may be used to calculate your BMI (body mass index). BMI is a measurement that tells if you are at a healthy weight.  Heart rate and blood pressure.  Body temperature.  Skin for abnormal spots. Counseling Your health care provider may ask you questions about your:  Past medical problems.  Family's medical history.  Alcohol, tobacco, and drug use.  Emotional well-being.  Home life and relationship well-being.  Sexual activity.  Diet, exercise, and sleep habits.  Work and work Statistician.  Access to firearms.  Method of birth control.  Menstrual cycle.  Pregnancy history. What immunizations do I need? Vaccines are usually given at various ages, according to a schedule. Your health care provider will recommend vaccines for you based on your age, medical history, and lifestyle or other factors, such as travel or where you work.   What tests do I need? Blood tests  Lipid and cholesterol levels. These may be checked every 5 years starting at age 64.  Hepatitis C test.  Hepatitis B test. Screening  Diabetes screening. This is done by checking your blood sugar (glucose) after you have not eaten for a while (fasting).  STD (sexually transmitted disease) testing, if you are at risk.  BRCA-related cancer screening. This may  be done if you have a family history of breast, ovarian, tubal, or peritoneal cancers.  Pelvic exam and Pap test. This may be done every 3 years starting at age 61. Starting at age 82, this may be done every 5 years if you have a Pap test in combination with an HPV test. Talk with your health care provider about your test results, treatment options, and if necessary, the need for more tests.   Follow these instructions at home: Eating and drinking  Eat a healthy diet that includes fresh fruits and vegetables, whole grains, lean protein, and low-fat dairy products.  Take vitamin and mineral supplements as recommended by your health care provider.  Do not drink alcohol if: ? Your health care provider tells you not to drink. ? You are pregnant, may be pregnant, or are planning to become pregnant.  If you drink alcohol: ? Limit how much you have to 0-1 drink a day. ? Be aware of how much alcohol is in your drink. In the [place] one drink equals one 12 oz bottle of beer (355 mL), one 5 oz glass of wine (148 mL), or one 1 oz glass of hard liquor (44 mL).   Lifestyle  Take daily care of your teeth and gums. Brush your teeth every morning and night with fluoride toothpaste. Floss one time each day.  Stay active. Exercise for at least 30 minutes 5 or more days each week.  Do not use any products that contain nicotine or tobacco, such as cigarettes, e-cigarettes, and chewing tobacco. If you need help quitting, ask your health care provider.  Do  not use drugs.  If you are sexually active, practice safe sex. Use a condom or other form of protection to prevent STIs (sexually transmitted infections).  If you do not wish to become pregnant, use a form of birth control. If you plan to become pregnant, see your health care provider for a prepregnancy visit.  Find healthy ways to cope with stress, such as: ? Meditation, yoga, or listening to music. ? Journaling. ? Talking to a trusted  person. ? Spending time with friends and family. Safety  Always wear your seat belt while driving or riding in a vehicle.  Do not drive: ? If you have been drinking alcohol. Do not ride with someone who has been drinking. ? When you are tired or distracted. ? While texting.  Wear a helmet and other protective equipment during sports activities.  If you have firearms in your house, make sure you follow all gun safety procedures.  Seek help if you have been physically or sexually abused. What's next?  Go to your health care provider once a year for an annual wellness visit.  Ask your health care provider how often you should have your eyes and teeth checked.  Stay up to date on all vaccines. This information is not intended to replace advice given to you by your health care provider. Make sure you discuss any questions you have with your health care provider. Document Revised: 10/14/2019 Document Reviewed: 10/27/2017 Elsevier Patient Education  2021 [employer].

## 2020-06-05 ENCOUNTER — Other Ambulatory Visit: Payer: Self-pay | Admitting: Certified Nurse Midwife

## 2020-06-05 DIAGNOSIS — B379 Candidiasis, unspecified: Secondary | ICD-10-CM

## 2020-06-05 DIAGNOSIS — B9689 Other specified bacterial agents as the cause of diseases classified elsewhere: Secondary | ICD-10-CM

## 2020-06-05 LAB — CERVICOVAGINAL ANCILLARY ONLY
Bacterial Vaginitis (gardnerella): POSITIVE — AB
Candida Glabrata: NEGATIVE
Candida Vaginitis: POSITIVE — AB
Comment: NEGATIVE
Comment: NEGATIVE
Comment: NEGATIVE

## 2020-06-05 MED ORDER — FLUCONAZOLE 150 MG PO TABS: 150.0000 mg | ORAL_TABLET | Freq: Once | ORAL | 0 refills | Status: AC

## 2020-06-05 MED ORDER — METRONIDAZOLE 500 MG PO TABS: 500.0000 mg | ORAL_TABLET | Freq: Two times a day (BID) | ORAL | 0 refills | Status: AC

## 2020-06-05 NOTE — Progress Notes (Signed)
Rx Flagyl and Diflucan, see orders.    Alicia Sullivan, CNM [facility], [facility] 06/05/20 3:17 PM

## 2020-07-04 ENCOUNTER — Encounter: Payer: Self-pay | Admitting: Certified Nurse Midwife

## 2020-07-04 ENCOUNTER — Other Ambulatory Visit: Payer: Self-pay

## 2020-07-04 ENCOUNTER — Other Ambulatory Visit (HOSPITAL_COMMUNITY)
Admission: RE | Admit: 2020-07-04 | Discharge: 2020-07-04 | Disposition: A | Discharge status: Home / Self Care | Payer: Medicaid Other | Source: Ambulatory Visit | Attending: Certified Nurse Midwife | Admitting: Certified Nurse Midwife

## 2020-07-04 ENCOUNTER — Ambulatory Visit (INDEPENDENT_AMBULATORY_CARE_PROVIDER_SITE_OTHER): Payer: Medicaid Other | Admitting: Certified Nurse Midwife

## 2020-07-04 VITALS — BP 118/84 | HR 69 | Resp 16 | Ht 59.0 in | Wt 160.9 lb

## 2020-07-04 DIAGNOSIS — Z01419 Encounter for gynecological examination (general) (routine) without abnormal findings: Secondary | ICD-10-CM | POA: Insufficient documentation

## 2020-07-04 DIAGNOSIS — Z8742 Personal history of other diseases of the female genital tract: Secondary | ICD-10-CM | POA: Diagnosis present

## 2020-07-04 DIAGNOSIS — Z6832 Body mass index (BMI) 32.0-32.9, adult: Secondary | ICD-10-CM

## 2020-07-04 DIAGNOSIS — Z124 Encounter for screening for malignant neoplasm of cervix: Secondary | ICD-10-CM | POA: Insufficient documentation

## 2020-07-04 LAB — CERVICOVAGINAL ANCILLARY ONLY
Bacterial Vaginitis (gardnerella): NEGATIVE
Candida Glabrata: NEGATIVE
Candida Vaginitis: POSITIVE — AB
Comment: NEGATIVE
Comment: NEGATIVE
Comment: NEGATIVE

## 2020-07-04 NOTE — Patient Instructions (Signed)
Preventive Care 21-23 Years Old, Female Preventive care refers to lifestyle choices and visits with your health care provider that can promote health and wellness. This includes:  A yearly physical exam. This is also called an annual wellness visit.  Regular dental and eye exams.  Immunizations.  Screening for certain conditions.  Healthy lifestyle choices, such as: ? Eating a healthy diet. ? Getting regular exercise. ? Not using drugs or products that contain nicotine and tobacco. ? Limiting alcohol use. What can I expect for my preventive care visit? Physical exam Your health care provider may check your:  Height and weight. These may be used to calculate your BMI (body mass index). BMI is a measurement that tells if you are at a healthy weight.  Heart rate and blood pressure.  Body temperature.  Skin for abnormal spots. Counseling Your health care provider may ask you questions about your:  Past medical problems.  Family's medical history.  Alcohol, tobacco, and drug use.  Emotional well-being.  Home life and relationship well-being.  Sexual activity.  Diet, exercise, and sleep habits.  Work and work environment.  Access to firearms.  Method of birth control.  Menstrual cycle.  Pregnancy history. What immunizations do I need? Vaccines are usually given at various ages, according to a schedule. Your health care provider will recommend vaccines for you based on your age, medical history, and lifestyle or other factors, such as travel or where you work.   What tests do I need? Blood tests  Lipid and cholesterol levels. These may be checked every 5 years starting at age 20.  Hepatitis C test.  Hepatitis B test. Screening  Diabetes screening. This is done by checking your blood sugar (glucose) after you have not eaten for a while (fasting).  STD (sexually transmitted disease) testing, if you are at risk.  BRCA-related cancer screening. This may be  done if you have a family history of breast, ovarian, tubal, or peritoneal cancers.  Pelvic exam and Pap test. This may be done every 3 years starting at age 21. Starting at age 30, this may be done every 5 years if you have a Pap test in combination with an HPV test. Talk with your health care provider about your test results, treatment options, and if necessary, the need for more tests.   Follow these instructions at home: Eating and drinking  Eat a healthy diet that includes fresh fruits and vegetables, whole grains, lean protein, and low-fat dairy products.  Take vitamin and mineral supplements as recommended by your health care provider.  Do not drink alcohol if: ? Your health care provider tells you not to drink. ? You are pregnant, may be pregnant, or are planning to become pregnant.  If you drink alcohol: ? Limit how much you have to 0-1 drink a day. ? Be aware of how much alcohol is in your drink. In the [place] one drink equals one 12 oz bottle of beer (355 mL), one 5 oz glass of wine (148 mL), or one 1 oz glass of hard liquor (44 mL).   Lifestyle  Take daily care of your teeth and gums. Brush your teeth every morning and night with fluoride toothpaste. Floss one time each day.  Stay active. Exercise for at least 30 minutes 5 or more days each week.  Do not use any products that contain nicotine or tobacco, such as cigarettes, e-cigarettes, and chewing tobacco. If you need help quitting, ask your health care provider.  Do not   use drugs.  If you are sexually active, practice safe sex. Use a condom or other form of protection to prevent STIs (sexually transmitted infections).  If you do not wish to become pregnant, use a form of birth control. If you plan to become pregnant, see your health care provider for a prepregnancy visit.  Find healthy ways to cope with stress, such as: ? Meditation, yoga, or listening to music. ? Journaling. ? Talking to a trusted  person. ? Spending time with friends and family. Safety  Always wear your seat belt while driving or riding in a vehicle.  Do not drive: ? If you have been drinking alcohol. Do not ride with someone who has been drinking. ? When you are tired or distracted. ? While texting.  Wear a helmet and other protective equipment during sports activities.  If you have firearms in your house, make sure you follow all gun safety procedures.  Seek help if you have been physically or sexually abused. What's next?  Go to your health care provider once a year for an annual wellness visit.  Ask your health care provider how often you should have your eyes and teeth checked.  Stay up to date on all vaccines. This information is not intended to replace advice given to you by your health care provider. Make sure you discuss any questions you have with your health care provider. Document Revised: 10/14/2019 Document Reviewed: 10/27/2017 Elsevier Patient Education  2021 Elsevier Inc.  

## 2020-07-04 NOTE — Progress Notes (Signed)
ANNUAL PREVENTATIVE CARE GYN  ENCOUNTER NOTE  Subjective:       Alicia Sullivan is a 25 y.o. G68P1001 female here for a routine annual gynecologic exam.  Current complaints: 1. Needs Pap smear 2. Requests vaginitis testing   Denies difficulty breathing or respiratory distress, chest pain, abdominal pain, excessive vaginal bleeding, dysuria, and leg pain or swelling.     Gynecologic History  Patient's last menstrual period was 06/22/2020 (within weeks).  Contraception: condoms  Last Pap: due  Obstetric History  OB History  Gravida Para Term Preterm AB Living  1 1 1     1   SAB IAB Ectopic Multiple Live Births        0 1    # Outcome Date GA Lbr Len/2nd Weight Sex Delivery Anes PTL Lv  1 Term 10/23/16 [redacted]w[redacted]d / 00:13 5 lb 4 oz (2.38 kg) F Vag-Spont EPI  LIV     Birth Comments: no anomalies noted at delivery    Past Medical History:  Diagnosis Date  . Allergic contact dermatitis 11/10/2016  . Amenorrhea, secondary    pregnant  . Anemia    WITH PREGNANCY ONLY  . Biliary colic   . Galactorrhea 01/08/2014  . Gallstones 09/22/2016  . 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 Irregular menses; hx   . 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 Recent urinary tract infection 02/18/2016   seen in ER  . Vaginal bleeding in pregnancy, first trimester 03/10/2016    Past Surgical History:  Procedure Laterality Date  . CHOLECYSTECTOMY N/A 04/24/2018   Procedure: LAPAROSCOPIC CHOLECYSTECTOMY;  Surgeon: 04/26/2018, MD;  Location: [facility] ORS;  Service: General;  Laterality: N/A;    No Known Allergies  Social History   Socioeconomic History  . Marital status: Married    Spouse name: Not on file  . Number of children: Not on file  . Years of education: Not on file  . Highest education level: Not on file  Occupational History    Comment: nonemployed  Tobacco Use  . Smoking status: Never Smoker  . Smokeless tobacco: Never Used  Vaping Use  . Vaping Use: Former  . Substances: Nicotine,  Flavoring  Substance and Sexual Activity  . Alcohol use: No  . Drug use: No  . Sexual activity: Yes    Partners: Male    Birth control/protection: Condom  Other Topics Concern  . Not on file  Social History Narrative  . Not on file   Social Determinants of Health   Financial Resource Strain: Not on file  Food Insecurity: Not on file  Transportation Needs: Not on file  Physical Activity: Not on file  Stress: Not on file  Social Connections: Not on file  Intimate Partner Violence: Not on file    Family History  Problem Relation Age of Onset  . Diabetes Father   . Hyperlipidemia Father     The following portions of the patient's history were reviewed and updated as appropriate: allergies, current medications, past family history, past medical history, past social history, past surgical history and problem list.  Review of Systems  ROS negative except as noted above. Information obtained from patient.    Objective:   BP 118/84   Pulse 69   Resp 16   Ht 4\' 11"  (1.499 m)   Wt 160 lb 14.4 oz (73 kg)   LMP 06/22/2020 (Within Weeks)   BMI 32.50 kg/m    CONSTITUTIONAL: Well-developed, well-nourished female in no acute distress.   PSYCHIATRIC: Normal mood  and affect. Normal behavior. Normal judgment and thought content.  NEUROLGIC: Alert and oriented to person, place, and time. Normal muscle tone coordination. No cranial nerve deficit noted.  HENT:  Normocephalic, atraumatic.  EYES: Conjunctivae and EOM are normal.  NECK: Normal range of motion, supple, no masses.  Normal thyroid.   SKIN: Skin is warm and dry. No rash noted. Not diaphoretic. No erythema. No pallor. Professional tattoos present.   CARDIOVASCULAR: Normal heart rate noted, regular rhythm, no murmur.  RESPIRATORY: Clear to auscultation bilaterally. Effort and breath sounds normal, no problems with  respiration noted.  BREASTS: Patient declined exam.   ABDOMEN: Soft, normal bowel sounds, no  distention noted.  No tenderness, rebound or guarding.   PELVIC:  External Genitalia: Normal  Vagina: Normal  Cervix: Normal, Pap and swab collected  MUSCULOSKELETAL: Normal range of motion. No tenderness.  No cyanosis, clubbing, or edema.  2+ distal pulses.  Assessment:   Annual gynecologic examination 25 y.o.   Contraception: condoms   Obesity 1   Problem List Items Addressed This Visit   None   Visit Diagnoses    Well woman exam    -  Primary   Relevant Orders   Cytology - PAP   CBC   TSH   Hemoglobin A1c   Cervicovaginal ancillary only   Screening for cervical cancer       Relevant Orders   Cytology - PAP   History of vaginitis       Relevant Orders   Cervicovaginal ancillary only   BMI 32.0-32.9,adult       Relevant Orders   CBC   TSH   Hemoglobin A1c      Plan:   Pap: Pap, Reflex if ASCUS  Labs: See orders   Routine preventative health maintenance measures emphasized: Exercise/Diet/Weight control, Tobacco Warnings, Alcohol/Substance use risks and Stress Management; see AVS  Reviewed red flag symptoms and when to call  Return to Clinic - 1 Year for [facility] or sooner if needed   Serafina Royals, CNM  [facility], [facility] 07/04/20 11:31 AM

## 2020-07-05 LAB — CBC
Hematocrit: 40 % (ref 34.0–46.6)
Hemoglobin: 13.7 g/dL (ref 11.1–15.9)
MCH: 30.8 pg (ref 26.6–33.0)
MCHC: 34.3 g/dL (ref 31.5–35.7)
MCV: 90 fL (ref 79–97)
Platelets: 259 10*3/uL (ref 150–450)
RBC: 4.45 x10E6/uL (ref 3.77–5.28)
RDW: 12.8 % (ref 11.7–15.4)
WBC: 8.6 10*3/uL (ref 3.4–10.8)

## 2020-07-05 LAB — HEMOGLOBIN A1C
Est. average glucose Bld gHb Est-mCnc: 111 mg/dL
Hgb A1c MFr Bld: 5.5 % (ref 4.8–5.6)

## 2020-07-05 LAB — TSH: TSH: 1.6 u[IU]/mL (ref 0.450–4.500)

## 2020-07-11 ENCOUNTER — Other Ambulatory Visit: Payer: Self-pay | Admitting: Certified Nurse Midwife

## 2020-07-11 DIAGNOSIS — B3731 Acute candidiasis of vulva and vagina: Secondary | ICD-10-CM

## 2020-07-11 DIAGNOSIS — B373 Candidiasis of vulva and vagina: Secondary | ICD-10-CM

## 2020-07-11 MED ORDER — FLUCONAZOLE 150 MG PO TABS: 150.0000 mg | ORAL_TABLET | Freq: Once | ORAL | 0 refills | Status: AC

## 2020-07-11 NOTE — Progress Notes (Signed)
Rx Diflucan, see orders.    Serafina Royals, CNM [facility], [facility] 07/11/20 5:30 PM

## 2020-07-16 LAB — CYTOLOGY - PAP: Diagnosis: NEGATIVE

## 2021-11-12 ENCOUNTER — Ambulatory Visit: Payer: Medicaid Other | Admitting: Family Medicine

## 2023-04-20 NOTE — Discharge Summary (Signed)
-------------------------------------------------------------------------------   Attestation signed by Randy Manuelita SAUNDERS, MD at 04/20/23 1025 Agree with documentation.  Manuelita SAUNDERS Randy, MD  -------------------------------------------------------------------------------  [facility] Discharge Summary  Discharge Date:  04/20/2023  Procedures Performed:    1.  Spontanteous Vaginal Delivery  2.     Diagnoses:  1.  Term Intrauterine Pregnancy  2.  A2 DM  3.  Delivery of viable female infant  Mom's Prenatal Labs  (pulled from labor and delivery nurse's documentation of labs):  O+, rubella immune, GBS positive  Discharge Condition:  Stable, ambulating and tolerating regular diet.  Discharge Instructions:  Patient is ready for discharge.  I have reviewed d/c instructions, including driving and activity limitations, self care, medication and use, symptoms to notify us  about immediately, and followup in our office.  Also discussed signs and symptoms of post partum depression and postpartum Pre-E.  Questions invited and answered, and she feels she is ready for discharge.   Discharge Disposition:  Home.  Discharge Meds:     Medication List     CHANGE how you take these medications      Instructions  ibuprofen  800 mg Tab Commonly known as: ADVIL ,MOTRIN  What changed: Another medication with the same name was added. Make sure you understand how and when to take each.  Take 1 Tablet by mouth every 8 hours as needed for Pain.   ibuprofen  800 mg Tab Commonly known as: ADVIL ,MOTRIN  What changed: You were already taking a medication with the same name, and this prescription was added. Make sure you understand how and when to take each.  Take 1 Tablet by mouth every 8 hours as needed.       CONTINUE taking these medications      Instructions  cetirizine 10 mg Tab Commonly known as: ZyrTEC  Take 1 Tablet by mouth daily.   Cranberry 400 mg Cap  cranberry unspecified unspecified    triamcinolone  0.1 % Crea Commonly known as: KENALOG   Apply  to affected area twice a day.       STOP taking these medications    ergocalciferol 1,250 mcg (50,000 unit) Cap Commonly known as: VITAMIN D2   prednisoLONE acetate 1 % Drps Commonly known as: PRED FORTE   predniSONE 10 mg Dspk Commonly known as: STERAPRED DS   predniSONE 20 mg Tab Commonly known as: DELTASONE         Followup:  1 and 6 Weeks at [facility]  Jillene FORBES Dage, CNM

## 2023-10-10 ENCOUNTER — Ambulatory Visit
